# Patient Record
Sex: Female | Born: 1970
Health system: Southern US, Community
[De-identification: ages and names within clinical notes are randomized; demographics above are authoritative.]

## PROBLEM LIST (undated history)

## (undated) DIAGNOSIS — Z808 Family history of malignant neoplasm of other organs or systems: Secondary | ICD-10-CM

## (undated) DIAGNOSIS — R112 Nausea with vomiting, unspecified: Secondary | ICD-10-CM

## (undated) DIAGNOSIS — Z803 Family history of malignant neoplasm of breast: Secondary | ICD-10-CM

## (undated) DIAGNOSIS — Z9889 Other specified postprocedural states: Secondary | ICD-10-CM

## (undated) DIAGNOSIS — K589 Irritable bowel syndrome without diarrhea: Secondary | ICD-10-CM

## (undated) DIAGNOSIS — R921 Mammographic calcification found on diagnostic imaging of breast: Secondary | ICD-10-CM

## (undated) DIAGNOSIS — Z8049 Family history of malignant neoplasm of other genital organs: Secondary | ICD-10-CM

## (undated) DIAGNOSIS — Z923 Personal history of irradiation: Secondary | ICD-10-CM

## (undated) DIAGNOSIS — Z800 Family history of malignant neoplasm of digestive organs: Secondary | ICD-10-CM

## (undated) DIAGNOSIS — C50919 Malignant neoplasm of unspecified site of unspecified female breast: Secondary | ICD-10-CM

## (undated) DIAGNOSIS — C50211 Malignant neoplasm of upper-inner quadrant of right female breast: Secondary | ICD-10-CM

## (undated) DIAGNOSIS — N6009 Solitary cyst of unspecified breast: Principal | ICD-10-CM

## (undated) HISTORY — DX: Family history of malignant neoplasm of other genital organs: Z80.49

## (undated) HISTORY — DX: Family history of malignant neoplasm of digestive organs: Z80.0

## (undated) HISTORY — DX: Malignant neoplasm of upper-inner quadrant of right female breast: C50.211

## (undated) HISTORY — DX: Malignant neoplasm of unspecified site of unspecified female breast: C50.919

## (undated) HISTORY — DX: Solitary cyst of unspecified breast: N60.09

## (undated) HISTORY — DX: Family history of malignant neoplasm of breast: Z80.3

## (undated) HISTORY — DX: Family history of malignant neoplasm of other organs or systems: Z80.8

## (undated) HISTORY — DX: Irritable bowel syndrome, unspecified: K58.9

---

## 1991-10-10 HISTORY — PX: BREAST BIOPSY: SHX20

## 1997-08-27 ENCOUNTER — Other Ambulatory Visit: Admission: RE | Admit: 1997-08-27 | Discharge: 1997-08-27 | Payer: Self-pay | Admitting: Obstetrics and Gynecology

## 1998-09-06 ENCOUNTER — Other Ambulatory Visit: Admission: RE | Admit: 1998-09-06 | Discharge: 1998-09-06 | Payer: Self-pay | Admitting: Obstetrics and Gynecology

## 1999-08-18 ENCOUNTER — Other Ambulatory Visit: Admission: RE | Admit: 1999-08-18 | Discharge: 1999-08-18 | Payer: Self-pay | Admitting: Obstetrics and Gynecology

## 2000-01-09 ENCOUNTER — Inpatient Hospital Stay (HOSPITAL_COMMUNITY): Admission: AD | Admit: 2000-01-09 | Discharge: 2000-01-09 | Payer: Self-pay | Admitting: Obstetrics and Gynecology

## 2000-03-08 ENCOUNTER — Inpatient Hospital Stay (HOSPITAL_COMMUNITY): Admission: AD | Admit: 2000-03-08 | Discharge: 2000-03-08 | Payer: Self-pay | Admitting: Obstetrics and Gynecology

## 2000-03-30 ENCOUNTER — Inpatient Hospital Stay (HOSPITAL_COMMUNITY): Admission: AD | Admit: 2000-03-30 | Discharge: 2000-04-02 | Payer: Self-pay | Admitting: Obstetrics and Gynecology

## 2000-10-16 ENCOUNTER — Other Ambulatory Visit: Admission: RE | Admit: 2000-10-16 | Discharge: 2000-10-16 | Payer: Self-pay | Admitting: Obstetrics and Gynecology

## 2001-10-22 ENCOUNTER — Other Ambulatory Visit: Admission: RE | Admit: 2001-10-22 | Discharge: 2001-10-22 | Payer: Self-pay | Admitting: Obstetrics and Gynecology

## 2002-10-27 ENCOUNTER — Other Ambulatory Visit: Admission: RE | Admit: 2002-10-27 | Discharge: 2002-10-27 | Payer: Self-pay | Admitting: Obstetrics and Gynecology

## 2003-11-03 ENCOUNTER — Other Ambulatory Visit: Admission: RE | Admit: 2003-11-03 | Discharge: 2003-11-03 | Payer: Self-pay | Admitting: Obstetrics and Gynecology

## 2004-11-03 ENCOUNTER — Other Ambulatory Visit: Admission: RE | Admit: 2004-11-03 | Discharge: 2004-11-03 | Payer: Self-pay | Admitting: Obstetrics and Gynecology

## 2008-03-05 ENCOUNTER — Encounter: Admission: RE | Admit: 2008-03-05 | Discharge: 2008-03-05 | Payer: Self-pay | Admitting: Neurology

## 2009-11-11 ENCOUNTER — Encounter: Admission: RE | Admit: 2009-11-11 | Discharge: 2009-11-11 | Payer: Self-pay | Admitting: Obstetrics and Gynecology

## 2010-05-27 NOTE — Op Note (Signed)
[facility]  Patient:    Renee Wright, Renee Wright                       MRN: [id number] Proc. Date: 03/30/00 Attending:  Janeece Riggers. Dareen Piano, M.D.                           Operative Report  PREOPERATIVE DIAGNOSES:       1. Intrauterine pregnancy at 69 weeks estimated                                  gestational age.                               2. History of prior cesarean section.                               3. The patient refuses attempt at vaginal birth                                  after cesarean section.  POSTOPERATIVE DIAGNOSES:      1. Intrauterine pregnancy at 56 weeks estimated                                  gestational age.                               2. History of prior cesarean section.                               3. The patient refuses attempt at vaginal birth                                  after cesarean section.  PROCEDURE:                    Repeat low transverse cesarean section.  SURGEON:                      Mark E. Dareen Piano, M.D.  ASSISTANT:                    Miguel Aschoff, M.D.  ANESTHESIA:                   Spinal.  ANTIBIOTICS:                  Ancef 1 g.  ESTIMATED BLOOD LOSS:         900 CC.  SPECIMENS:                    None.  COMPLICATIONS:                None.  FINDINGS:                     The patient had normal-appearing fallopian tubes and ovaries.  The placenta  appeared to be normal.  The patient delivered one live viable white female infant weighing 7 lb 15 oz.  DESCRIPTION OF PROCEDURE:     The patient was taken to the operating room, where a spinal anesthetic was administered without complication.  She was then placed in the dorsal supine position with a left lateral tilt.  She was prepped with Hibiclens and a Foley catheter placed.  She was then draped in the usual fashion for this procedure.  A Pfannenstiel incision was made through the previous scar.  This was carried down to the fascia.  The  fascia was nicked in the midline and extended laterally.  The rectus muscles were then separated from the fascia with a Bovie.  The rectus muscles were divided in the midline and taken superiorly and inferiorly.  The parietal peritoneum was entered sharply.  A bladder flap was taken down sharply.  A low transverse uterine incision was made in the midline and extended laterally with blunt dissection.  The amniotic sac was then entered with a hemostat.  Amniotic fluid was noted to be clear.  The infant was delivered in the vertex presentation.  On delivery of the head, the oropharynx and nostrils were bulb suctioned.  The remaining infant was then delivered.  The cord was doubly clamped and cut and the infant handed to the waiting NICU team.  Cord blood was then obtained.  The placenta was then manually removed.  The uterus was exteriorized.  The uterine cavity was wiped with a wet lap.  The uterine incision was closed in single layer of 0 chromic in a running locking fashion. The bladder flap was closed using 3-0 chromic in a running fashion.  The uterus was then placed back in the abdominal cavity.  Hemostasis was checked and found to be adequate.  The abdominal gutters were wiped with a wet lap. The parietal peritoneum was closed using 3-0 chromic in a running fashion. The fascia was closed using 0 Monocryl in a running fashion.  The subcuticular tissue was then made hemostatic with a Bovie.  The previous scar was excised. The subcuticular tissue was closes using interrupted 2-0 cut suture. Stainless steel clips were used to close skin.  The patient tolerated the procedure well.  She was taken to the recovery room in stable condition. Instrument and lap counts were correct x 2. DD:  03/30/00 TD:  04/01/00 Job: [id number]

## 2010-05-27 NOTE — Discharge Summary (Signed)
[facility]  Patient:    Renee Wright, Renee Wright                     MRN: [id number] Adm. Date:  [id number] Disc. Date: [id number] Attending:  Osborn Coho                           Discharge Summary  ADMISSION DIAGNOSES:          1. Intrauterine pregnancy at 39 weeks.                               2. Previous cesarean section, desires repeat                                  cesarean section.  DISCHARGE DIAGNOSES:          1. Intrauterine pregnancy at 39 weeks.                               2. Previous cesarean section, desires repeat                                  cesarean section.  HOSPITAL COURSE:              The patient is a 40 year old gravida 2, para 1, with previous cesarean section, who presents for repeat C-section.  She underwent a repeat C-section without incident.  On postoperative day #1 was doing very well.  Her postoperative hematocrit was 31.2, and postoperative white blood cell count on postoperative day #1 was 13.8.  By postoperative day #3 she was ambulating, tolerating regular food, was afebrile, with stable vital signs.  She was discharged home on postoperative day #3.  She is discharged home with a prescription for Tylox and Motrin to take as needed for pain.  She will follow up in the office in four weeks.  She was advised to call if she has any nausea, any vomiting, any temperature greater than 100.5, or any redness or drainage from the incision site. DD:  04/18/00 TD:  04/18/00 Job: [id number]

## 2010-12-19 ENCOUNTER — Other Ambulatory Visit: Payer: Self-pay | Admitting: Obstetrics and Gynecology

## 2010-12-19 DIAGNOSIS — R928 Other abnormal and inconclusive findings on diagnostic imaging of breast: Secondary | ICD-10-CM

## 2010-12-20 ENCOUNTER — Ambulatory Visit
Admission: RE | Admit: 2010-12-20 | Discharge: 2010-12-20 | Disposition: A | Discharge status: Home / Self Care | Payer: BC Managed Care – PPO | Source: Ambulatory Visit | Attending: Obstetrics and Gynecology | Admitting: Obstetrics and Gynecology

## 2010-12-20 ENCOUNTER — Other Ambulatory Visit: Payer: Self-pay | Admitting: Obstetrics and Gynecology

## 2010-12-20 DIAGNOSIS — R921 Mammographic calcification found on diagnostic imaging of breast: Secondary | ICD-10-CM

## 2010-12-20 DIAGNOSIS — R928 Other abnormal and inconclusive findings on diagnostic imaging of breast: Secondary | ICD-10-CM

## 2010-12-23 ENCOUNTER — Ambulatory Visit
Admission: RE | Admit: 2010-12-23 | Discharge: 2010-12-23 | Disposition: A | Discharge status: Home / Self Care | Payer: BC Managed Care – PPO | Source: Ambulatory Visit | Attending: Obstetrics and Gynecology | Admitting: Obstetrics and Gynecology

## 2010-12-23 DIAGNOSIS — R921 Mammographic calcification found on diagnostic imaging of breast: Secondary | ICD-10-CM

## 2010-12-30 ENCOUNTER — Other Ambulatory Visit: Payer: Self-pay

## 2011-01-10 DIAGNOSIS — R921 Mammographic calcification found on diagnostic imaging of breast: Secondary | ICD-10-CM

## 2011-01-10 HISTORY — DX: Mammographic calcification found on diagnostic imaging of breast: R92.1

## 2011-01-13 DIAGNOSIS — N6009 Solitary cyst of unspecified breast: Secondary | ICD-10-CM

## 2011-01-13 HISTORY — DX: Solitary cyst of unspecified breast: N60.09

## 2011-01-17 ENCOUNTER — Other Ambulatory Visit (INDEPENDENT_AMBULATORY_CARE_PROVIDER_SITE_OTHER): Payer: Self-pay | Admitting: Surgery

## 2011-01-17 ENCOUNTER — Ambulatory Visit (INDEPENDENT_AMBULATORY_CARE_PROVIDER_SITE_OTHER): Payer: BC Managed Care – PPO | Admitting: Surgery

## 2011-01-17 ENCOUNTER — Encounter (INDEPENDENT_AMBULATORY_CARE_PROVIDER_SITE_OTHER): Payer: Self-pay | Admitting: Surgery

## 2011-01-17 VITALS — BP 100/64 | HR 68 | Resp 12 | Ht 65.0 in | Wt 129.0 lb

## 2011-01-17 DIAGNOSIS — R921 Mammographic calcification found on diagnostic imaging of breast: Secondary | ICD-10-CM

## 2011-01-17 DIAGNOSIS — R928 Other abnormal and inconclusive findings on diagnostic imaging of breast: Secondary | ICD-10-CM

## 2011-01-17 NOTE — Progress Notes (Signed)
NAME: Renee Wright                                                                                      DOB: 01/03/1971 DATE: 01/17/2011               MRN: [id number]   CC:  Chief Complaint  Patient presents with  . Breast Problem    calcifications right    HPI:  Renee Wright is a 40 y.o.  female who was referred  by Dr. Brozzettifor evaluation of right breast calcifications. These were incidentally noted and not amenable to a NCB  PMH:  has no past medical history on file.  PSH:   has past surgical history that includes Cesarean section (05/29/96, 03/30/00) and Breast biopsy (10/1991).  ALLERGIES:  No Known Allergies  MEDICATIONS: Current outpatient prescriptions:[name] 3-0.02 MG tablet, , Disp: , Rfl:   ROS: She has filled out our 12 point review of systems and it is negative.   EXAM:   GENERAL:  The patient is alert, oriented, and generally healthy-appearing, NAD. Mood and affect are normal.  HEENT:  The head is normocephalic, the eyes nonicteric, the pupils were round regular and equal. EOMs are normal. Pharynx normal. Dentition good.  NECK:  The neck is supple and there are no masses or thyromegaly.  LUNGS: Normal respirations and clear to auscultation.  HEART: Regular rhythm, with no murmurs rubs or gallops. Pulses are intact carotid dorsalis pedis and posterior tibial. No significant varicosities are noted.  BREASTS: Small, dense, symmetric, no mass, tenderness, skin changes or discharge  ABDOMEN: Soft, flat, and nontender. No masses or organomegaly is noted. No hernias are noted. Bowel sounds are normal.  EXTREMITIES:  Good range of motion, no edema.   DATA REVIEWED:  Mammogram noted  IMPRESSION:  Breast calcifications, right, not amenable to stereo biopsy  PLAN:   NL Excision. Discussed alternatives including 6 month f/u and MRI She would like to schedule surgery. I have discussed the indications for the lumpectomy and described the procedure. She  understand that the chance of removal of the abnormal area is very good, but that occasionally we are unable to locate it and may have to do a second procedure. We also discussed the possibility of a second procedure to get additional tissue. Risks of surgery such as bleeding and infection have also been explained, as well as the implications of not doing the surgery. She understands and wishes to proceed.   Jolyn Deshmukh J 01/17/2011  CC: Brozzetti, Kelly Ann, MD, ANDERSON,MARK E, MD        

## 2011-01-17 NOTE — Patient Instructions (Signed)
We will arrange surgery to remove the small area of calcium deposits in your right breast

## 2011-01-30 ENCOUNTER — Encounter (HOSPITAL_BASED_OUTPATIENT_CLINIC_OR_DEPARTMENT_OTHER): Payer: Self-pay | Admitting: *Deleted

## 2011-02-02 ENCOUNTER — Encounter (HOSPITAL_BASED_OUTPATIENT_CLINIC_OR_DEPARTMENT_OTHER): Payer: Self-pay | Admitting: Certified Registered Nurse Anesthetist

## 2011-02-02 ENCOUNTER — Encounter (HOSPITAL_BASED_OUTPATIENT_CLINIC_OR_DEPARTMENT_OTHER): Payer: Self-pay | Admitting: *Deleted

## 2011-02-02 ENCOUNTER — Encounter (HOSPITAL_BASED_OUTPATIENT_CLINIC_OR_DEPARTMENT_OTHER): Payer: Self-pay | Admitting: Anesthesiology

## 2011-02-02 ENCOUNTER — Other Ambulatory Visit (INDEPENDENT_AMBULATORY_CARE_PROVIDER_SITE_OTHER): Payer: Self-pay | Admitting: Surgery

## 2011-02-02 ENCOUNTER — Ambulatory Visit (HOSPITAL_BASED_OUTPATIENT_CLINIC_OR_DEPARTMENT_OTHER)
Admission: RE | Admit: 2011-02-02 | Discharge: 2011-02-02 | Disposition: A | Discharge status: Home / Self Care | Payer: BC Managed Care – PPO | Source: Ambulatory Visit | Attending: Surgery | Admitting: Surgery

## 2011-02-02 ENCOUNTER — Ambulatory Visit (HOSPITAL_BASED_OUTPATIENT_CLINIC_OR_DEPARTMENT_OTHER): Payer: BC Managed Care – PPO | Admitting: Anesthesiology

## 2011-02-02 ENCOUNTER — Ambulatory Visit
Admission: RE | Admit: 2011-02-02 | Discharge: 2011-02-02 | Disposition: A | Discharge status: Home / Self Care | Payer: BC Managed Care – PPO | Source: Ambulatory Visit | Attending: Surgery | Admitting: Surgery

## 2011-02-02 ENCOUNTER — Encounter (HOSPITAL_BASED_OUTPATIENT_CLINIC_OR_DEPARTMENT_OTHER)
Admission: RE | Disposition: A | Discharge status: Home / Self Care | Payer: Self-pay | Source: Ambulatory Visit | Attending: Surgery

## 2011-02-02 DIAGNOSIS — R921 Mammographic calcification found on diagnostic imaging of breast: Secondary | ICD-10-CM

## 2011-02-02 DIAGNOSIS — N6029 Fibroadenosis of unspecified breast: Secondary | ICD-10-CM | POA: Insufficient documentation

## 2011-02-02 DIAGNOSIS — D249 Benign neoplasm of unspecified breast: Secondary | ICD-10-CM

## 2011-02-02 DIAGNOSIS — N6019 Diffuse cystic mastopathy of unspecified breast: Secondary | ICD-10-CM

## 2011-02-02 DIAGNOSIS — R928 Other abnormal and inconclusive findings on diagnostic imaging of breast: Secondary | ICD-10-CM | POA: Insufficient documentation

## 2011-02-02 DIAGNOSIS — N6089 Other benign mammary dysplasias of unspecified breast: Secondary | ICD-10-CM | POA: Insufficient documentation

## 2011-02-02 HISTORY — DX: Mammographic calcification found on diagnostic imaging of breast: R92.1

## 2011-02-02 HISTORY — PX: BREAST BIOPSY: SHX20

## 2011-02-02 HISTORY — PX: BREAST EXCISIONAL BIOPSY: SUR124

## 2011-02-02 LAB — POCT HEMOGLOBIN-HEMACUE: Hemoglobin: 14.2 g/dL (ref 12.0–15.0)

## 2011-02-02 SURGERY — BREAST BIOPSY WITH NEEDLE LOCALIZATION
Anesthesia: General | Site: Breast | Laterality: Right | Wound class: Clean

## 2011-02-02 MED ORDER — METOCLOPRAMIDE HCL 5 MG/ML IJ SOLN: 10.0000 mg | Freq: Once | INTRAMUSCULAR | Status: DC | PRN

## 2011-02-02 MED ORDER — METOCLOPRAMIDE HCL 5 MG/ML IJ SOLN
INTRAMUSCULAR | Status: DC | PRN
  Administered 2011-02-02: 10 mg via INTRAVENOUS

## 2011-02-02 MED ORDER — CHLORHEXIDINE GLUCONATE 4 % EX LIQD: 1.0000 "application " | Freq: Once | CUTANEOUS | Status: DC

## 2011-02-02 MED ORDER — OXYCODONE HCL 5 MG PO TABS: 5.0000 mg | ORAL_TABLET | ORAL | Status: AC | PRN

## 2011-02-02 MED ORDER — FENTANYL CITRATE 0.05 MG/ML IJ SOLN: 25.0000 ug | INTRAMUSCULAR | Status: DC | PRN

## 2011-02-02 MED ORDER — MIDAZOLAM HCL 5 MG/5ML IJ SOLN
INTRAMUSCULAR | Status: DC | PRN
  Administered 2011-02-02: 2 mg via INTRAVENOUS

## 2011-02-02 MED ORDER — FENTANYL CITRATE 0.05 MG/ML IJ SOLN
INTRAMUSCULAR | Status: DC | PRN
  Administered 2011-02-02: 50 ug via INTRAVENOUS
  Administered 2011-02-02: 100 ug via INTRAVENOUS

## 2011-02-02 MED ORDER — DEXAMETHASONE SODIUM PHOSPHATE 4 MG/ML IJ SOLN
INTRAMUSCULAR | Status: DC | PRN
  Administered 2011-02-02: 10 mg via INTRAVENOUS

## 2011-02-02 MED ORDER — CEFAZOLIN SODIUM 1-5 GM-% IV SOLN
1.0000 g | INTRAVENOUS | Status: AC
  Administered 2011-02-02: 1 g via INTRAVENOUS

## 2011-02-02 MED ORDER — LIDOCAINE HCL (CARDIAC) 20 MG/ML IV SOLN
INTRAVENOUS | Status: DC | PRN
  Administered 2011-02-02: 50 mg via INTRAVENOUS

## 2011-02-02 MED ORDER — MIDAZOLAM HCL 2 MG/2ML IJ SOLN: 0.5000 mg | INTRAMUSCULAR | Status: DC | PRN

## 2011-02-02 MED ORDER — BUPIVACAINE HCL (PF) 0.25 % IJ SOLN
INTRAMUSCULAR | Status: DC | PRN
  Administered 2011-02-02: 20 mL

## 2011-02-02 MED ORDER — PROPOFOL 10 MG/ML IV EMUL
INTRAVENOUS | Status: DC | PRN
  Administered 2011-02-02: 150 mg via INTRAVENOUS

## 2011-02-02 MED ORDER — MORPHINE SULFATE 4 MG/ML IJ SOLN: 0.0500 mg/kg | INTRAMUSCULAR | Status: DC | PRN

## 2011-02-02 MED ORDER — ACETAMINOPHEN 10 MG/ML IV SOLN
1000.0000 mg | Freq: Once | INTRAVENOUS | Status: AC
  Administered 2011-02-02: 1000 mg via INTRAVENOUS

## 2011-02-02 MED ORDER — LACTATED RINGERS IV SOLN
INTRAVENOUS | Status: DC
  Administered 2011-02-02: 13:00:00 via INTRAVENOUS

## 2011-02-02 MED ORDER — ONDANSETRON HCL 4 MG/2ML IJ SOLN
INTRAMUSCULAR | Status: DC | PRN
  Administered 2011-02-02: 4 mg via INTRAVENOUS

## 2011-02-02 SURGICAL SUPPLY — 43 items
APPLICATOR COTTON TIP 6IN STRL (MISCELLANEOUS) IMPLANT
BINDER BREAST LRG (GAUZE/BANDAGES/DRESSINGS) IMPLANT
BINDER BREAST MEDIUM (GAUZE/BANDAGES/DRESSINGS) ×2 IMPLANT
BINDER BREAST XLRG (GAUZE/BANDAGES/DRESSINGS) IMPLANT
BINDER BREAST XXLRG (GAUZE/BANDAGES/DRESSINGS) IMPLANT
BLADE HEX COATED 2.75 (ELECTRODE) ×2 IMPLANT
BLADE SURG 15 STRL LF DISP TIS (BLADE) ×1 IMPLANT
BLADE SURG 15 STRL SS (BLADE) ×1
CANISTER SUCTION 1200CC (MISCELLANEOUS) ×2 IMPLANT
CHLORAPREP W/TINT 26ML (MISCELLANEOUS) ×2 IMPLANT
CLIP TI MEDIUM 6 (CLIP) IMPLANT
CLIP TI WIDE RED SMALL 6 (CLIP) IMPLANT
CLOTH BEACON ORANGE TIMEOUT ST (SAFETY) ×2 IMPLANT
COVER MAYO STAND STRL (DRAPES) ×2 IMPLANT
COVER TABLE BACK 60X90 (DRAPES) ×2 IMPLANT
DECANTER SPIKE VIAL GLASS SM (MISCELLANEOUS) IMPLANT
DERMABOND ADVANCED (GAUZE/BANDAGES/DRESSINGS) ×1
DERMABOND ADVANCED .7 DNX12 (GAUZE/BANDAGES/DRESSINGS) ×1 IMPLANT
DEVICE DUBIN W/COMP PLATE 8390 (MISCELLANEOUS) ×2 IMPLANT
DRAPE LAPAROTOMY TRNSV 102X78 (DRAPE) ×2 IMPLANT
DRAPE UTILITY XL STRL (DRAPES) ×2 IMPLANT
ELECT REM PT RETURN 9FT ADLT (ELECTROSURGICAL) ×2
ELECTRODE REM PT RTRN 9FT ADLT (ELECTROSURGICAL) ×1 IMPLANT
GLOVE EUDERMIC 7 POWDERFREE (GLOVE) ×2 IMPLANT
GOWN PREVENTION PLUS XLARGE (GOWN DISPOSABLE) ×4 IMPLANT
KIT MARKER MARGIN INK (KITS) IMPLANT
NEEDLE HYPO 25X1 1.5 SAFETY (NEEDLE) ×2 IMPLANT
NS IRRIG 1000ML POUR BTL (IV SOLUTION) ×2 IMPLANT
PACK BASIN DAY SURGERY FS (CUSTOM PROCEDURE TRAY) ×2 IMPLANT
PENCIL BUTTON HOLSTER BLD 10FT (ELECTRODE) ×2 IMPLANT
SLEEVE SCD COMPRESS KNEE MED (MISCELLANEOUS) ×2 IMPLANT
SPONGE GAUZE 4X4 12PLY (GAUZE/BANDAGES/DRESSINGS) ×2 IMPLANT
SPONGE INTESTINAL PEANUT (DISPOSABLE) IMPLANT
SPONGE LAP 4X18 X RAY DECT (DISPOSABLE) ×2 IMPLANT
STAPLER VISISTAT 35W (STAPLE) IMPLANT
SUT MNCRL AB 4-0 PS2 18 (SUTURE) ×2 IMPLANT
SUT SILK 0 TIES 10X30 (SUTURE) IMPLANT
SUT VICRYL 3-0 CR8 SH (SUTURE) ×2 IMPLANT
SYR CONTROL 10ML LL (SYRINGE) ×2 IMPLANT
TOWEL OR NON WOVEN STRL DISP B (DISPOSABLE) ×2 IMPLANT
TUBE CONNECTING 20X1/4 (TUBING) ×2 IMPLANT
WATER STERILE IRR 1000ML POUR (IV SOLUTION) ×2 IMPLANT
YANKAUER SUCT BULB TIP NO VENT (SUCTIONS) ×2 IMPLANT

## 2011-02-02 NOTE — Anesthesia Preprocedure Evaluation (Signed)
Anesthesia Evaluation  Patient identified by MRN, date of birth, ID band Patient awake    Reviewed: Allergy & Precautions, H&P , NPO status , Patient's Chart, lab work & pertinent test results, reviewed documented beta blocker date and time   Airway Mallampati: II TM Distance: >3 FB Neck ROM: full    Dental   Pulmonary neg pulmonary ROS,          Cardiovascular neg cardio ROS     Neuro/Psych Negative Neurological ROS  Negative Psych ROS   GI/Hepatic negative GI ROS, Neg liver ROS,   Endo/Other  Negative Endocrine ROS  Renal/GU negative Renal ROS  Genitourinary negative   Musculoskeletal   Abdominal   Peds  Hematology negative hematology ROS (+)   Anesthesia Other Findings See surgeon's H&P   Reproductive/Obstetrics negative OB ROS                           Anesthesia Physical Anesthesia Plan  ASA: I  Anesthesia Plan: General   Post-op Pain Management:    Induction: Intravenous  Airway Management Planned: LMA  Additional Equipment:   Intra-op Plan:   Post-operative Plan: Extubation in OR  Informed Consent: I have reviewed the patients History and Physical, chart, labs and discussed the procedure including the risks, benefits and alternatives for the proposed anesthesia with the patient or authorized representative who has indicated his/her understanding and acceptance.     Plan Discussed with: CRNA and Surgeon  Anesthesia Plan Comments:         Anesthesia Quick Evaluation  

## 2011-02-02 NOTE — Anesthesia Procedure Notes (Signed)
Procedure Name: LMA Insertion Performed by: Aveline Daus Pre-anesthesia Checklist: Patient identified, Timeout performed, Emergency Drugs available, Suction available and Patient being monitored Patient Re-evaluated:Patient Re-evaluated prior to inductionOxygen Delivery Method: Circle System Utilized Preoxygenation: Pre-oxygenation with 100% oxygen Intubation Type: IV induction Ventilation: Mask ventilation without difficulty LMA: LMA inserted LMA Size: 4.0 Number of attempts: 1 Placement Confirmation: breath sounds checked- equal and bilateral and positive ETCO2 Tube secured with: Tape Dental Injury: Teeth and Oropharynx as per pre-operative assessment      

## 2011-02-02 NOTE — Interval H&P Note (Signed)
History and Physical Interval Note:  02/02/2011 1:29 PM  Renee Wright  has presented today for surgery, with the diagnosis of Right breast calcifications  The various methods of treatment have been discussed with the patient and family. After consideration of risks, benefits and other options for treatment, the patient has consented to  Procedure(s): BREAST BIOPSY WITH NEEDLE LOCALIZATION as a surgical intervention .  The patients' history has been reviewed, patient examined, no change in status, stable for surgery.  I have reviewed the patients' chart and labs.  Questions were answered to the patient's satisfaction.  I initialed the right breast as the operative side   Lyndsy Gilberto J

## 2011-02-02 NOTE — Anesthesia Postprocedure Evaluation (Signed)
Anesthesia Post Note  Patient: Renee Wright  Procedure(s) Performed:  BREAST BIOPSY WITH NEEDLE LOCALIZATION - removal of right breast calcifications  Anesthesia type: General  Patient location: PACU  Post pain: Pain level controlled  Post assessment: Patient's Cardiovascular Status Stable  Last Vitals:  Filed Vitals:   02/02/11 1500  BP: 103/63  Pulse: 87  Temp:   Resp: 16    Post vital signs: Reviewed and stable  Level of consciousness: alert  Complications: No apparent anesthesia complications

## 2011-02-02 NOTE — Transfer of Care (Signed)
Immediate Anesthesia Transfer of Care Note  Patient: Renee Wright  Procedure(s) Performed:  BREAST BIOPSY WITH NEEDLE LOCALIZATION - removal of right breast calcifications  Patient Location: PACU  Anesthesia Type: General  Level of Consciousness: sedated  Airway & Oxygen Therapy: Patient Spontanous Breathing and Patient connected to face mask oxygen  Post-op Assessment: Report given to PACU RN and Post -op Vital signs reviewed and stable  Post vital signs: Reviewed and stable  Complications: No apparent anesthesia complications

## 2011-02-02 NOTE — Op Note (Signed)
Renee Wright  06/03/1970  [id number]  02/02/2011   Preoperative diagnosis: Abnormal calcifications right breast upper outer quadrant  Postoperative diagnosis: Same  Procedure: Needle guided excision breast calcifications right upper-outer quadrant  Surgeon: Currie Paris, MD, FACS  Anesthesia: General  Clinical History and Indications: This patient was found to have some calcifications in the upper-outer quadrant. These were indeterminate by mammogram and not amenable to needle core biopsy. Excisional biopsy was recommended.  Description of Procedure:I saw the patient preoperative area and from the plans as noted above. I marked the right breast as the operative side. I reviewed the mammogram films and spoke with Dr. RE Rowe Pavy the location. The guidewire entered medially and one laterally. The calcifications appear to be just in the upper-outer quadrant.  Patient taken to the operative room after satisfaction she'll anesthesia the breast was prepped and draped in a time out was done. The calcifications appear to be 3.5 cm from the skin entry site of the guidewire. I marked that area and made a curvilinear incision centered on the mark. I elevated the thin skin flaps going medially so I could manipulate the guidewire into the wound. It is fairly superficial in its tract.I then took tissue around the guidewire. The patient's breasts are fairly thin and I was down on the deep margin to the pre-muscular fatty plane. The specimen mammogram was reviewed with Dr. Azucena Kuba andthe calcifications appear to be in the specimen.  I put 20 cc of 0.25% plain Marcaine and to help with postop pain relief. I elevated the breast tissue off of the pectoralis and closed the deep layers with 3-0 Vicryl. The more superficial breast tissue was closed with 3-0 Vicryl and then the skin with 4-0 Monocryl subcuticular plus Dermabond. The patient tolerated the procedure well. There were no operative complications.  All counts were correct.   EBL: Minimal  Currie Paris, MD, FACS 02/02/2011 2:33 PM

## 2011-02-02 NOTE — H&P (View-Only) (Signed)
NAME: Renee Wright                                                                                      DOB: 07/16/1970 DATE: 01/17/2011               MRN: [id number]   CC:  Chief Complaint  Patient presents with  . Breast Problem    calcifications right    HPI:  Renee Wright is a 41 y.o.  female who was referred  by Dr. Luciano Cutter evaluation of right breast calcifications. These were incidentally noted and not amenable to a NCB  PMH:  has no past medical history on file.  PSH:   has past surgical history that includes Cesarean section (05/29/96, 03/30/00) and Breast biopsy (10/1991).  ALLERGIES:  No Known Allergies  MEDICATIONS: Current outpatient prescriptions:[name] 3-0.02 MG tablet, , Disp: , Rfl:   ROS: She has filled out our 12 point review of systems and it is negative.   EXAM:   GENERAL:  The patient is alert, oriented, and generally healthy-appearing, NAD. Mood and affect are normal.  HEENT:  The head is normocephalic, the eyes nonicteric, the pupils were round regular and equal. EOMs are normal. Pharynx normal. Dentition good.  NECK:  The neck is supple and there are no masses or thyromegaly.  LUNGS: Normal respirations and clear to auscultation.  HEART: Regular rhythm, with no murmurs rubs or gallops. Pulses are intact carotid dorsalis pedis and posterior tibial. No significant varicosities are noted.  BREASTS: Small, dense, symmetric, no mass, tenderness, skin changes or discharge  ABDOMEN: Soft, flat, and nontender. No masses or organomegaly is noted. No hernias are noted. Bowel sounds are normal.  EXTREMITIES:  Good range of motion, no edema.   DATA REVIEWED:  Mammogram noted  IMPRESSION:  Breast calcifications, right, not amenable to stereo biopsy  PLAN:   NL Excision. Discussed alternatives including 6 month f/u and MRI She would like to schedule surgery. I have discussed the indications for the lumpectomy and described the procedure. She  understand that the chance of removal of the abnormal area is very good, but that occasionally we are unable to locate it and may have to do a second procedure. We also discussed the possibility of a second procedure to get additional tissue. Risks of surgery such as bleeding and infection have also been explained, as well as the implications of not doing the surgery. She understands and wishes to proceed.   Renee Wright 01/17/2011  CC: Henrene Pastor, MD, Levi Aland, MD

## 2011-02-03 ENCOUNTER — Encounter: Payer: Self-pay | Admitting: *Deleted

## 2011-02-06 ENCOUNTER — Telehealth (INDEPENDENT_AMBULATORY_CARE_PROVIDER_SITE_OTHER): Payer: Self-pay | Admitting: General Surgery

## 2011-02-06 NOTE — Telephone Encounter (Signed)
Patient made aware of path results. Will follow up at appt and call with any questions prior.  

## 2011-02-06 NOTE — Telephone Encounter (Signed)
Message copied by Liliana Cline on Mon Feb 06, 2011  4:22 PM ------      Message from: Currie Paris      Created: Mon Feb 06, 2011 10:56 AM       Tell her path is benign and as expected

## 2011-02-15 ENCOUNTER — Ambulatory Visit (INDEPENDENT_AMBULATORY_CARE_PROVIDER_SITE_OTHER): Payer: BC Managed Care – PPO | Admitting: Surgery

## 2011-02-15 ENCOUNTER — Encounter (INDEPENDENT_AMBULATORY_CARE_PROVIDER_SITE_OTHER): Payer: Self-pay | Admitting: Surgery

## 2011-02-15 VITALS — BP 106/68 | HR 56 | Temp 98.0°F | Resp 12 | Ht 65.0 in | Wt 126.6 lb

## 2011-02-15 DIAGNOSIS — R928 Other abnormal and inconclusive findings on diagnostic imaging of breast: Secondary | ICD-10-CM

## 2011-02-15 DIAGNOSIS — R921 Mammographic calcification found on diagnostic imaging of breast: Secondary | ICD-10-CM

## 2011-02-15 DIAGNOSIS — Z09 Encounter for follow-up examination after completed treatment for conditions other than malignant neoplasm: Secondary | ICD-10-CM

## 2011-02-15 NOTE — Patient Instructions (Signed)
We will see you again on an as needed basis. Please call the office at [phone] if you have any questions or concerns. Thank you for allowing us to take care of you.  

## 2011-02-15 NOTE — Progress Notes (Signed)
NAME: Renee Wright                                            DOB: [birth year] DATE: 02/15/2011                                                  MRN: [id number]  CC: Post op   HPI: This patient comes in for post op follow-up.Sheunderwent wire localized right breast biopsy for calcifications on 01/17/2011. She feels that she is doing well.  PE: General: The patient appears to be healthy, NAD Incision healing nicely, no infection  DATA REVIEWED: Path noted - benign   IMPRESSION: The patient is doing well S/P NL excisional biopsy.    PLAN: RTC PRN Gave patient a copy of path and discussed it with her

## 2011-10-13 ENCOUNTER — Ambulatory Visit (INDEPENDENT_AMBULATORY_CARE_PROVIDER_SITE_OTHER): Payer: BC Managed Care – PPO | Admitting: Surgery

## 2011-10-13 ENCOUNTER — Encounter (INDEPENDENT_AMBULATORY_CARE_PROVIDER_SITE_OTHER): Payer: Self-pay | Admitting: Surgery

## 2011-10-13 VITALS — BP 118/68 | HR 74 | Temp 98.0°F | Resp 18 | Ht 65.0 in | Wt 127.0 lb

## 2011-10-13 DIAGNOSIS — N6009 Solitary cyst of unspecified breast: Secondary | ICD-10-CM

## 2011-10-13 NOTE — Progress Notes (Signed)
Renee Wright DOB: 02-13-1970 MRN: [id number]                                                                                      DATE: 10/13/2011  PCP: Levi Aland, MD Referring Provider: Levi Aland, MD  IMPRESSION:  Breast cystr  PLAN:   I discussed the alternatives of aspiration versus followup. This was present on a prior ultrasound 9 months ago and basically has perhaps an enlarged little bit but continues to be benign appearing. She would like to follow up rather than aspiration at this time                 CC:  Chief Complaint  Patient presents with  . Breast Mass    Right    HPI:  Renee Wright is a 41 y.o.  female who presents for evaluation of a newly found right breast mass. She noted it while bathing. It is not painful. We did a NL excision of calcs in the upper oouter quadrant of the right breast but this is more in the 7:30 position, away from the original biopsy  PMH:  has a past medical history of Breast calcification, right (01/2011).  PSH:   has past surgical history that includes Cesarean section (05/29/96, 03/30/00); Breast biopsy (10/1991); Breast biopsy (02/02/2011); and Breast lumpectomy (01/17/11).  ALLERGIES:  No Known Allergies  MEDICATIONS: Current outpatient prescriptions:[name] 3-0.02 MG tablet, 1 tablet daily. PM, Disp: , Rfl:   ROS: She has filled out our 12 point review of systems and it is negative. EXAM:   VS: BP 118/68  Pulse 74  Temp 98 F (36.7 C) (Oral)  Resp 18  Ht 5\' 5"  (1.651 m)  Wt 127 lb (57.607 kg)  BMI 21.13 kg/m2 General: The patient is alert, oreinted, NAD Breasts: Symmetric, well healed scar in UOQ right. Well circmscibed mass in the 7:30 position at the areolar edge. No other mass. Breasts dense bilaterally. Not tender. Lymphatics: NO axillary or supraclavicular adenopathy noted  DATA REVIEWED:  Ultrasound in our office shows benign appearing cyst. Old son at breast center showed cyst at same  locale,    Currie Paris 10/13/2011  CC: Levi Aland, MD, Levi Aland, MD

## 2011-10-13 NOTE — Patient Instructions (Signed)
Continue breast self exams and annual mammograms. See me again if any new problems

## 2011-10-27 ENCOUNTER — Encounter (INDEPENDENT_AMBULATORY_CARE_PROVIDER_SITE_OTHER): Payer: BC Managed Care – PPO | Admitting: Surgery

## 2011-12-26 ENCOUNTER — Other Ambulatory Visit: Payer: Self-pay | Admitting: Obstetrics and Gynecology

## 2012-12-30 ENCOUNTER — Other Ambulatory Visit: Payer: Self-pay | Admitting: Obstetrics and Gynecology

## 2014-01-13 ENCOUNTER — Other Ambulatory Visit: Payer: Self-pay | Admitting: Obstetrics and Gynecology

## 2014-01-14 LAB — CYTOLOGY - PAP

## 2014-09-24 ENCOUNTER — Institutional Professional Consult (permissible substitution): Payer: BLUE CROSS/BLUE SHIELD | Admitting: Neurology

## 2014-10-19 ENCOUNTER — Encounter: Payer: Self-pay | Admitting: Neurology

## 2014-10-19 ENCOUNTER — Ambulatory Visit (INDEPENDENT_AMBULATORY_CARE_PROVIDER_SITE_OTHER): Payer: BLUE CROSS/BLUE SHIELD | Admitting: Neurology

## 2014-10-19 VITALS — BP 100/60 | HR 72 | Resp 20 | Ht 65.0 in | Wt 130.0 lb

## 2014-10-19 DIAGNOSIS — F515 Nightmare disorder: Secondary | ICD-10-CM

## 2014-10-19 DIAGNOSIS — G4750 Parasomnia, unspecified: Secondary | ICD-10-CM

## 2014-10-19 DIAGNOSIS — K582 Mixed irritable bowel syndrome: Secondary | ICD-10-CM | POA: Diagnosis not present

## 2014-10-19 DIAGNOSIS — G478 Other sleep disorders: Secondary | ICD-10-CM | POA: Diagnosis not present

## 2014-10-19 MED ORDER — MELATONIN 10 MG PO TABS: 10.0000 mg | ORAL_TABLET | Freq: Every evening | ORAL | Status: DC

## 2014-10-19 NOTE — Patient Instructions (Signed)
Keep a sleep diary. Document the effects ( or not / of Melatonin ) . If this supresses the dreams, you need not to proceed to prescription drugs. If melatonin alone taken 30 minutes before intended bedtime does not suppress the sleep activity in the next 3 months I would like to offer you Klonopin as a prescription medication.  Clonazepam tablets What is this medicine? CLONAZEPAM (kloe NA ze pam) is a benzodiazepine. It is used to treat certain types of seizures. It is also used to treat panic disorder. This medicine may be used for other purposes; ask your health care provider or pharmacist if you have questions. What should I tell my health care provider before I take this medicine? They need to know if you have any of these conditions: -an alcohol or drug abuse problem -bipolar disorder, depression, psychosis or other mental health condition -glaucoma -kidney or liver disease -lung or breathing disease -myasthenia gravis -Parkinson's disease -porphyria -seizures or a history of seizures -suicidal thoughts -an unusual or allergic reaction to clonazepam, other benzodiazepines, foods, dyes, or preservatives -pregnant or trying to get pregnant -breast-feeding How should I use this medicine? Take this medicine by mouth with a glass of water. Follow the directions on the prescription label. If it upsets your stomach, take it with food or milk. Take your medicine at regular intervals. Do not take it more often than directed. Do not stop taking or change the dose except on the advice of your doctor or health care professional. A special MedGuide will be given to you by the pharmacist with each prescription and refill. Be sure to read this information carefully each time. Talk to your pediatrician regarding the use of this medicine in children. Special care may be needed. Overdosage: If you think you have taken too much of this medicine contact a poison control center or emergency room at  once. NOTE: This medicine is only for you. Do not share this medicine with others. What if I miss a dose? If you miss a dose, take it as soon as you can. If it is almost time for your next dose, take only that dose. Do not take double or extra doses. What may interact with this medicine? -herbal or dietary supplements -medicines for depression, anxiety, or psychotic disturbances -medicines for fungal infections like fluconazole, itraconazole, ketoconazole, voriconazole -medicines for HIV infection or AIDS -medicines for sleep -prescription pain medicines -propantheline -rifampin -sevelamer -some medicines for seizures like carbamazepine, phenobarbital, phenytoin, primidone This list may not describe all possible interactions. Give your health care provider a list of all the medicines, herbs, non-prescription drugs, or dietary supplements you use. Also tell them if you smoke, drink alcohol, or use illegal drugs. Some items may interact with your medicine. What should I watch for while using this medicine? Visit your doctor or health care professional for regular checks on your progress. Your body may become dependent on this medicine. If you have been taking this medicine regularly for some time, do not suddenly stop taking it. You must gradually reduce the dose or you may get severe side effects. Ask your doctor or health care professional for advice before increasing or decreasing the dose. Even after you stop taking this medicine it can still affect your body for several days. If you suffer from several types of seizures, this medicine may increase the chance of grand mal seizures (epilepsy). Let your doctor or health care professional know, he or she may want to prescribe an additional medicine. You  may get drowsy or dizzy. Do not drive, use machinery, or do anything that needs mental alertness until you know how this medicine affects you. To reduce the risk of dizzy and fainting spells, do not  stand or sit up quickly, especially if you are an older patient. Alcohol may increase dizziness and drowsiness. Avoid alcoholic drinks. Do not treat yourself for coughs, colds or allergies without asking your doctor or health care professional for advice. Some ingredients can increase possible side effects. The use of this medicine may increase the chance of suicidal thoughts or actions. Pay special attention to how you are responding while on this medicine. Any worsening of mood, or thoughts of suicide or dying should be reported to your health care professional right away. Women who become pregnant while using this medicine may enroll in the [place] Pregnancy Registry by calling [phone]. This registry collects information about the safety of antiepileptic drug use during pregnancy. What side effects may I notice from receiving this medicine? Side effects that you should report to your doctor or health care professional as soon as possible: -allergic reactions like skin rash, itching or hives, swelling of the face, lips, or tongue -changes in vision -confusion -depression -hallucinations -mood changes, excitability or aggressive behavior -movement difficulty, staggering or jerky movements -muscle cramps, weakness -tremors -unusual eye movements Side effects that usually do not require medical attention (report to your doctor or health care professional if they continue or are bothersome): -constipation or diarrhea -difficulty sleeping, nightmares -dizziness, drowsiness -headache -increased saliva from your mouth -nausea, vomiting This list may not describe all possible side effects. Call your doctor for medical advice about side effects. You may report side effects to FDA at 1-800-FDA-1088. Where should I keep my medicine? Keep out of the reach of children. This medicine can be abused. Keep your medicine in a safe place to protect it from theft. Do not share  this medicine with anyone. Selling or giving away this medicine is dangerous and against the law. This medicine may cause accidental overdose and death if taken by other adults, children, or pets. Mix any unused medicine with a substance like cat litter or coffee grounds. Then throw the medicine away in a sealed container like a sealed bag or a coffee can with a lid. Do not use the medicine after the expiration date. Store at room temperature between 15 and 30 degrees C (59 and 86 degrees F). Protect from light. Keep container tightly closed. NOTE: This sheet is a summary. It may not cover all possible information. If you have questions about this medicine, talk to your doctor, pharmacist, or health care provider.    2016, [employer]. (2014-04-07 14:01:43)

## 2014-10-19 NOTE — Progress Notes (Signed)
[facility]INE [facility]   Provider:  Larey Seat, M D  Referring Provider: Olga Millers, MD Primary Care Physician:  Ronita Hipps, MD  Chief Complaint  Patient presents with  . New Patient (Initial Visit)    sleep consult, acting out on dreams, rm 11, alone    HPI:  Renee Wright is a 44 y.o. female , seen here as a referral  from Dr. Ouida Sills for parasomnias. The patient is a former patient of Dr Tressia Danas.   Chief complaint according to patient : " I will see spiders or intruders at night and finally realize I was dreaming- I will wake up and realize this is a dream, and I remember the content"  The patient is a well  groomed and pleasant caucasian female, right handed, married with 2 children, who reports onset of sleep behaviors about 2 years ago, age 49.    Sleep habits are as follows: The patient reports that she usually goes to bed between 11 and 11:30 PM. She will fall asleep promptly and if she has one of her vivid dreams that night usually hassles with him the first 90 minutes of sleep. She has not woken up at 4 AM or later with similar complaints. During the week she has to arise at around 6:30 and usually feels refreshed or restored from her sleep. She does usually not wake with headaches or dry mouth. She is not known to snore and no apneas have been witnessed or reported to her. She does not have nocturia. She usually gets about 7 hours of nocturnal sleep and feels that the quality of her sleep is sufficient. She prefers to sleep on her side and describes her bedroom is cool, quiet and dark. She shares a bedroom with her husband. No pets in the bedroom.  She wakes up with palpitations sometimes diaphoretic as the dreams of vivid and threatening. As I eluded above she often has a vision of an intruder or a spider being in the bedroom. She has crawled over her husband still escapes a spider ones, but she is not known to kick was rash or blocks. She is also not set up in  bed and yelled loudly. At one time she had injured her head falling out of bed and onto a nightstand this was while at vacation in [place]. It is typically that injuries occur when the patient is in unfamiliar surroundings and she is aware of this. She had a brain MRI at the time that was regarded as normal. She is acting out dreams to some degree but not in a violent fashion.  One time she dreamt that the bedroom's wall was on fire or " glowing " but she soon realized and became aware of her real environment and her dreams ended she was fine and could go back to sleep. She also remembers her dreams usually which is unusual for sleep walking disorder.  The occurrence is not nightly but perhaps twice a month. But she has woken with physiological signs of distress such as diaphoresis and palpitations. She is not excessively daytime sleepy and she reports that her Epworth sleepiness score of only 5 points. She has also not experienced any sleep paralysis, she does usually not have the irresistible urge to go to sleep and does not nap on regular days. Her fatigue score is also low at 20 points. She reports no cataplectic attacks, dream intrusion or hypnagogic-hypnopompic hallucinations.  Sleep medical history and family sleep history:  No family history . A paternal aunt suffers from Parkinson's disease, a brother was born with hydrocephalus, the brother tested negative for obstructive sleep apnea.  Social history: The patient is a nonsmoker, she rarely drinks alcohol less than 2-3 beverages per month, she does not use any illegal drugs. She will drink a small cup of coffee in the morning and usually a glass of iced tea during the day. No caffeine intake at dinnertime.  Review of Systems: Out of a complete 14 system review, the patient complains of only the following symptoms, and all other reviewed systems are negative.   Epworth score 5 , Fatigue severity score 22  , depression score N/A     Social History   Social History  . Marital Status: Married    Spouse Name: N/A  . Number of Children: N/A  . Years of Education: N/A   Occupational History  . [employer] History Main Topics  . Smoking status: Never Smoker   . Smokeless tobacco: Never Used  . Alcohol Use: No  . Drug Use: No  . Sexual Activity: Not on file   Other Topics Concern  . Not on file   Social History Narrative   Married, 2 children.      Drinks one cup of caffeine daily.    Family History  Problem Relation Age of Onset  . Breast cancer Maternal Aunt   . Cancer Maternal Aunt     breast  . Uterine cancer Mother   . Cancer Mother     uterine    Past Medical History  Diagnosis Date  . Breast calcification, right 01/2011  . Breast cyst 01/13/2011    Right breast at 7:30 areolar edge   . IBS (irritable bowel syndrome)     Past Surgical History  Procedure Laterality Date  . Cesarean section  05/29/96, 03/30/00  . Breast biopsy  10/1991  . Breast biopsy  02/02/2011    Procedure: BREAST BIOPSY WITH NEEDLE LOCALIZATION;  Surgeon: Haywood Lasso, MD;  Location: [facility];  Service: General;  Laterality: Right;  removal of right breast calcifications  . Breast lumpectomy  01/17/11    benign- right breast (NCB)    Current Outpatient Prescriptions  Medication Sig Dispense Refill  . ergocalciferol (VITAMIN D2) 50000 UNITS capsule Take 50,000 Units by mouth once a week.    Marland Kitchen [name] 3-0.02 MG tablet 1 tablet daily. PM     No current facility-administered medications for this visit.    Allergies as of 10/19/2014  . (No Known Allergies)    Vitals: BP 100/60 mmHg  Pulse 72  Resp 20  Ht 5\' 5"  (1.651 m)  Wt 130 lb (58.968 kg)  BMI 21.63 kg/m2 Last Weight:  Wt Readings from Last 1 Encounters:  10/19/14 130 lb (58.968 kg)   PPJ:KDTO mass index is 21.63 kg/(m^2).     Last Height:   Ht Readings from Last 1 Encounters:  10/19/14 5\' 5"  (1.651 m)     Physical exam:  General: The patient is awake, alert and appears not in acute distress. The patient is well groomed. Head: Normocephalic, atraumatic. Neck is supple. Mallampati 2,  neck circumference:14. Nasal airflow unrestricted TMJ is not evident . Retrognathia is seen.  Cardiovascular: Regular rate and rhythm without  murmurs or carotid bruit, and without distended neck veins. Respiratory: Lungs are clear to auscultation. Skin:  Without evidence of edema, or rash Trunk: BMI and posture is normal.  Neurologic exam : The patient is awake and alert, oriented to place and time.   Memory subjective  described as intact.  Attention span & concentration ability appears normal.  Speech is fluent,  without dysarthria, dysphonia or aphasia.  Mood and affect are appropriate.  Cranial nerves: Pupils are equal and briskly reactive to light.  Funduscopic exam without evidence of pallor or edema. Extraocular movements  in vertical and horizontal planes intact and without nystagmus. Visual fields by finger perimetry are intact. Hearing to finger rub intact.   Facial sensation intact to fine touch.  Facial motor strength is symmetric and tongue and uvula move midline. Shoulder shrug was symmetrical.   Motor exam:   Normal tone, muscle bulk and symmetric strength in all extremities.  Sensory:  Fine touch, pinprick and vibration were tested in all extremities. Proprioception tested in the upper extremities was normal.  Coordination: Rapid alternating movements in the fingers/hands was normal. Finger-to-nose maneuver  normal without evidence of ataxia, dysmetria or tremor.  Gait and station: Patient walks without assistive device and is able unassisted to climb up to the exam table. Strength within normal limits.  Stance is stable and normal.  Toe and hell stand were tested .Tandem gait is unfragmented. Turns with  3  Steps. Romberg testing is  negative.  Deep tendon reflexes: in the  upper  and lower extremities are symmetric and intact. Babinski maneuver response is downgoing.  The patient was advised of the nature of the diagnosed sleep disorder , the treatment options and risks for general a health and wellness arising from not treating the condition.  I spent more than 50 minutes of face to face time with the patient. Greater than 50% of time was spent in counseling and coordination of care. We have discussed the diagnosis and differential and I answered the patient's questions.  The patient and her husband had already investigated different forms of parasomnia through the Internet and were concerned that the patient may suffer from a REM sleep behavior disorder which could be an early marker  to later onset of Parkinson's symptoms. My physical exam included the eye movements the muscle tone of biceps and wrist and her range of motion for the neck. There was no parkinsonian features found.   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 She has had the onset of these symptoms about 2 years ago and there was no medical condition / medication that may have triggered this.  In addition the early night onset is more typical for a slow wave sleep parasomnia.  That she usually doesn't leave the bed is more typical for a REM sleep behavior.  She is literally between the 2 definitions. She is also not excessively daytime sleepy or fatigued ; so narcolepsy or cataplexy certainly can be ruled out.    Plan:  Treatment plan and additional workup :  A sleep study would give very little yield for patient who has a parasomnia once or twice a month. I think it is better to use a sleep diary and follow the  pattern also to identify if there are trigger factors including nutrition, caffeine, alcohol intake etc. This medication became then see if this pattern further changes. I do not  order an imaging study of the brain because the patient had one in the last 18 months that was regarded as normal.  She has no history of a seizure disorder and certainly had only one event that may come classified as amnestic which was what led to her fall out of bed when she was in a [place]. What I would like to do is to give her a trial of low-dose Klonopin if she is concerned about a benzodiazepine I would asked her to use melatonin at night. Often melatonin alone is enough in REM behavior disorder to depress the  behavior.  Klonopin can be used when she travels or is in a foreign environment and will help her with accident prevention. There are no parkinsonian features/ symptoms.     Asencion Partridge Branden Vine MD  10/19/2014   CC: Olga Millers, [address], [place] [id number]

## 2015-01-10 DIAGNOSIS — Z923 Personal history of irradiation: Secondary | ICD-10-CM

## 2015-01-10 HISTORY — DX: Personal history of irradiation: Z92.3

## 2015-01-19 ENCOUNTER — Ambulatory Visit: Payer: BLUE CROSS/BLUE SHIELD | Admitting: Neurology

## 2015-03-02 ENCOUNTER — Other Ambulatory Visit: Payer: Self-pay | Admitting: Obstetrics and Gynecology

## 2015-03-03 LAB — CYTOLOGY - PAP

## 2015-03-05 ENCOUNTER — Other Ambulatory Visit: Payer: Self-pay | Admitting: Obstetrics and Gynecology

## 2015-03-05 DIAGNOSIS — R928 Other abnormal and inconclusive findings on diagnostic imaging of breast: Secondary | ICD-10-CM

## 2015-03-15 ENCOUNTER — Other Ambulatory Visit: Payer: Self-pay | Admitting: Obstetrics and Gynecology

## 2015-03-15 ENCOUNTER — Ambulatory Visit
Admission: RE | Admit: 2015-03-15 | Discharge: 2015-03-15 | Disposition: A | Discharge status: Home / Self Care | Payer: BLUE CROSS/BLUE SHIELD | Source: Ambulatory Visit | Attending: Obstetrics and Gynecology | Admitting: Obstetrics and Gynecology

## 2015-03-15 DIAGNOSIS — R928 Other abnormal and inconclusive findings on diagnostic imaging of breast: Secondary | ICD-10-CM

## 2015-03-20 HISTORY — PX: BREAST BIOPSY: SHX20

## 2015-03-25 ENCOUNTER — Other Ambulatory Visit: Payer: Self-pay | Admitting: Obstetrics and Gynecology

## 2015-03-25 ENCOUNTER — Ambulatory Visit
Admission: RE | Admit: 2015-03-25 | Discharge: 2015-03-25 | Disposition: A | Discharge status: Home / Self Care | Payer: BLUE CROSS/BLUE SHIELD | Source: Ambulatory Visit | Attending: Obstetrics and Gynecology | Admitting: Obstetrics and Gynecology

## 2015-03-25 DIAGNOSIS — R928 Other abnormal and inconclusive findings on diagnostic imaging of breast: Secondary | ICD-10-CM

## 2015-03-25 DIAGNOSIS — N6310 Unspecified lump in the right breast, unspecified quadrant: Secondary | ICD-10-CM

## 2015-03-26 ENCOUNTER — Encounter: Payer: Self-pay | Admitting: *Deleted

## 2015-03-26 ENCOUNTER — Telehealth: Payer: Self-pay | Admitting: *Deleted

## 2015-03-26 DIAGNOSIS — C50211 Malignant neoplasm of upper-inner quadrant of right female breast: Secondary | ICD-10-CM

## 2015-03-26 DIAGNOSIS — Z170 Estrogen receptor positive status [ER+]: Secondary | ICD-10-CM | POA: Insufficient documentation

## 2015-03-26 HISTORY — DX: Malignant neoplasm of upper-inner quadrant of right female breast: C50.211

## 2015-03-26 NOTE — Telephone Encounter (Signed)
Confirmed [facility] for 03/31/15 at 1215.  Instructions and contact information given.

## 2015-03-26 NOTE — Telephone Encounter (Signed)
Mailed clinic packet to pt.  

## 2015-03-29 ENCOUNTER — Other Ambulatory Visit: Payer: BLUE CROSS/BLUE SHIELD

## 2015-03-31 ENCOUNTER — Other Ambulatory Visit (HOSPITAL_BASED_OUTPATIENT_CLINIC_OR_DEPARTMENT_OTHER): Payer: BLUE CROSS/BLUE SHIELD

## 2015-03-31 ENCOUNTER — Ambulatory Visit
Admission: RE | Admit: 2015-03-31 | Discharge: 2015-03-31 | Disposition: A | Discharge status: Home / Self Care | Payer: BLUE CROSS/BLUE SHIELD | Source: Ambulatory Visit | Attending: Radiation Oncology | Admitting: Radiation Oncology

## 2015-03-31 ENCOUNTER — Telehealth: Payer: Self-pay | Admitting: Oncology

## 2015-03-31 ENCOUNTER — Ambulatory Visit: Payer: BLUE CROSS/BLUE SHIELD | Attending: Surgery | Admitting: Physical Therapy

## 2015-03-31 ENCOUNTER — Ambulatory Visit (HOSPITAL_BASED_OUTPATIENT_CLINIC_OR_DEPARTMENT_OTHER): Payer: BLUE CROSS/BLUE SHIELD | Admitting: Oncology

## 2015-03-31 ENCOUNTER — Encounter: Payer: Self-pay | Admitting: Oncology

## 2015-03-31 ENCOUNTER — Encounter: Payer: Self-pay | Admitting: Physical Therapy

## 2015-03-31 ENCOUNTER — Encounter: Payer: Self-pay | Admitting: Nurse Practitioner

## 2015-03-31 VITALS — BP 136/62 | HR 66 | Temp 98.2°F | Resp 18 | Ht 65.0 in | Wt 128.5 lb

## 2015-03-31 DIAGNOSIS — Z170 Estrogen receptor positive status [ER+]: Secondary | ICD-10-CM

## 2015-03-31 DIAGNOSIS — C50211 Malignant neoplasm of upper-inner quadrant of right female breast: Secondary | ICD-10-CM

## 2015-03-31 DIAGNOSIS — C50811 Malignant neoplasm of overlapping sites of right female breast: Secondary | ICD-10-CM

## 2015-03-31 LAB — COMPREHENSIVE METABOLIC PANEL
ALBUMIN: 3.5 g/dL (ref 3.5–5.0)
ALK PHOS: 49 U/L (ref 40–150)
ALT: <9 U/L (ref 0–55)
AST: 27 U/L (ref 5–34)
Anion Gap: 7 mEq/L (ref 3–11)
BILIRUBIN TOTAL: 0.74 mg/dL (ref 0.20–1.20)
BUN: 9.8 mg/dL (ref 7.0–26.0)
CO2: 26 mEq/L (ref 22–29)
Calcium: 9.2 mg/dL (ref 8.4–10.4)
Chloride: 108 mEq/L (ref 98–109)
Creatinine: 0.9 mg/dL (ref 0.6–1.1)
EGFR: 81 mL/min/{1.73_m2} — AB (ref >90)
GLUCOSE: 89 mg/dL (ref 70–140)
Potassium: 3.7 mEq/L (ref 3.5–5.1)
SODIUM: 141 meq/L (ref 136–145)
TOTAL PROTEIN: 7 g/dL (ref 6.4–8.3)

## 2015-03-31 LAB — CBC WITH DIFFERENTIAL/PLATELET
BASO%: 0.6 % (ref 0.0–2.0)
Basophils Absolute: 0 10*3/uL (ref 0.0–0.1)
EOS ABS: 0 10*3/uL (ref 0.0–0.5)
EOS%: 0.4 % (ref 0.0–7.0)
HEMATOCRIT: 39.3 % (ref 34.8–46.6)
HEMOGLOBIN: 12.8 g/dL (ref 11.6–15.9)
LYMPH#: 1.5 10*3/uL (ref 0.9–3.3)
LYMPH%: 27 % (ref 14.0–49.7)
MCH: 28 pg (ref 25.1–34.0)
MCHC: 32.6 g/dL (ref 31.5–36.0)
MCV: 85.7 fL (ref 79.5–101.0)
MONO#: 0.5 10*3/uL (ref 0.1–0.9)
MONO%: 8.9 % (ref 0.0–14.0)
NEUT%: 63.1 % (ref 38.4–76.8)
NEUTROS ABS: 3.5 10*3/uL (ref 1.5–6.5)
Platelets: 219 10*3/uL (ref 145–400)
RBC: 4.58 10*6/uL (ref 3.70–5.45)
RDW: 13.5 % (ref 11.2–14.5)
WBC: 5.6 10*3/uL (ref 3.9–10.3)

## 2015-03-31 NOTE — Patient Instructions (Signed)

## 2015-03-31 NOTE — Progress Notes (Signed)
Radiation Oncology         [phone] ________________________________  Initial outpatient Consultation  Name: Renee Wright MRN: [id number]  Date: 03/31/2015  DOB: [birth year]  OJ:JKKX,FGHWEX S, MD  Erroll Luna, MD   REFERRING PHYSICIAN: Erroll Luna, MD  DIAGNOSIS:    ICD-9-CM ICD-10-CM   1. Breast cancer of upper-inner quadrant of right female breast ([place]) 174.2 C50.211    Stage IA [id number] Right Breast UIQ Invasive Ductal Carcinoma w/ DCIS, ER+ / PR+ / Her2neg, Grade 1  HISTORY OF PRESENT ILLNESS::Renee Wright is a 45 y.o. female who presented with a right breast mass on screening mammogram.  The mass was 68m on [place]   Biopsy showed IDC and DCIS with characteristics as described above in the diagnosis.  She is in her USOH otherwise.  She does have a contributory family history and will undergo genetic testing. She developed a rash after her biopsy over her right breast.  PAST MEDICAL HISTORY:  has a past medical history of Breast calcification, right (01/2011); Breast cyst (01/13/2011); IBS (irritable bowel syndrome); Breast cancer of upper-inner quadrant of right female breast ([facility] (03/26/2015); and Breast cancer ([place].    PAST SURGICAL HISTORY: Past Surgical History  Procedure Laterality Date  . Cesarean section  05/29/96, 03/30/00  . Breast biopsy  10/1991  . Breast biopsy  02/02/2011    Procedure: BREAST BIOPSY WITH NEEDLE LOCALIZATION;  Surgeon: CHaywood Lasso MD;  [facility]  Service: General;  Laterality: Right;  removal of right breast calcifications  . Breast lumpectomy  01/17/11    benign- right breast ([facility])    FAMILY HISTORY: family history includes Breast cancer in her maternal aunt; Cancer in her maternal aunt and mother; Colon cancer in her paternal grandmother; Melanoma in her maternal grandmother; Uterine cancer in her mother.  SOCIAL HISTORY:  reports that she has never smoked. She has never used smokeless tobacco. She  reports that she drinks alcohol. She reports that she does not use illicit drugs.  ALLERGIES: Review of patient's allergies indicates no known allergies.  MEDICATIONS:  Current Outpatient Prescriptions  Medication Sig Dispense Refill  . ergocalciferol (VITAMIN D2) 50000 UNITS capsule Take 50,000 Units by mouth once a week. Reported on 03/31/2015    . [name] 3-0.02 MG tablet 1 tablet daily. PM    . Melatonin 10 MG TABS Take 10 mg by mouth Nightly. (Patient not taking: Reported on 03/31/2015) 30 tablet 5   No current facility-administered medications for this encounter.    REVIEW OF SYSTEMS:  Notable for that above.   PHYSICAL EXAM:    Vitals with BMI 03/31/2015  Height '5\' 5"'   Weight 128 lbs 8 oz  BMI 293.7 Systolic 1169 Diastolic 62  Pulse 66  Respirations 18   General: Alert and oriented, in no acute distress HEENT: Head is normocephalic.  Neck: Neck is supple, no palpable cervical or supraclavicular lymphadenopathy. Heart: Regular in rate and rhythm with no murmurs, rubs, or gallops. Chest: Clear to auscultation bilaterally, with no rhonchi, wheezes, or rales. Abdomen: Soft, nontender, nondistended, with no rigidity or guarding. Extremities: No cyanosis or edema. Lymphatics: see Neck Exam Skin: No concerning lesions.  Non-malignant appearing rash over right breast (reactive in nature, erythematous) Musculoskeletal: symmetric strength and muscle tone throughout. Neurologic: Cranial nerves II through XII are grossly intact. No obvious focalities. Speech is fluent. Coordination is intact. Psychiatric: Judgment and insight are intact. Affect is appropriate. Breasts: slight thicenking (<1cm) in upper right  breast at biopsy site.  No other palpable masses appreciated in the breasts or axillae bilaterally    ECOG = 0  0 - Asymptomatic (Fully active, able to carry on all predisease activities without restriction)  1 - Symptomatic but completely ambulatory (Restricted in  physically strenuous activity but ambulatory and able to carry out work of a light or sedentary nature. For example, light housework, office work)  2 - Symptomatic, <50% in bed during the day (Ambulatory and capable of all self care but unable to carry out any work activities. Up and about more than 50% of waking hours)  3 - Symptomatic, >50% in bed, but not bedbound (Capable of only limited self-care, confined to bed or chair 50% or more of waking hours)  4 - Bedbound (Completely disabled. Cannot carry on any self-care. Totally confined to bed or chair)  5 - Death   Eustace Pen MM, Creech RH, Tormey DC, et al. [phone]). "Toxicity and response criteria of the [facility]". [place] Oncol. 5 (6): 649-55   LABORATORY DATA:  Lab Results  Component Value Date   WBC 5.6 03/31/2015   HGB 12.8 03/31/2015   HCT 39.3 03/31/2015   MCV 85.7 03/31/2015   PLT 219 03/31/2015   CMP     Component Value Date/Time   NA 141 03/31/2015 1218   K 3.7 03/31/2015 1218   CO2 26 03/31/2015 1218   GLUCOSE 89 03/31/2015 1218   BUN 9.8 03/31/2015 1218   CREATININE 0.9 03/31/2015 1218   CALCIUM 9.2 03/31/2015 1218   PROT 7.0 03/31/2015 1218   ALBUMIN 3.5 03/31/2015 1218   AST 27 03/31/2015 1218   ALT <9 03/31/2015 1218   ALKPHOS 49 03/31/2015 1218   BILITOT 0.74 03/31/2015 1218         RADIOGRAPHY: Mm Digital Diagnostic Unilat R  03/25/2015  CLINICAL DATA:  45 year old female status post ultrasound-guided right breast biopsy EXAM: DIAGNOSTIC RIGHT MAMMOGRAM POST ULTRASOUND BIOPSY COMPARISON:  Previous exam(s). FINDINGS: Mammographic images were obtained following ultrasound guided biopsy of a suspicious right breast mass at 12:30, 9 cm from the nipple. Post biopsy mammogram demonstrates the coil shaped biopsy marker to be in the expected location within the superior right breast on the lateral view. The biopsied mass is not visualized on the CC view due to its superior and posterior  location. IMPRESSION: Satisfactory marker placement post ultrasound-guided right breast biopsy. Final Assessment: Post Procedure Mammograms for Marker Placement Electronically Signed   By: Pamelia Hoit M.D.   On: 03/25/2015 15:53   US Breast Ltd Uni Right Inc Axilla  03/15/2015  CLINICAL DATA:  Patient returns today to evaluate a possible right breast mass identified on recent screening mammogram. EXAM: DIGITAL DIAGNOSTIC RIGHT MAMMOGRAM WITH 3D TOMOSYNTHESIS WITH CAD ULTRASOUND RIGHT BREAST COMPARISON:  Previous exams including recent screening mammogram dated 03/02/2015. ACR Breast Density Category c: The breast tissue is heterogeneously dense, which may obscure small masses. FINDINGS: On today's additional views of the right breast with 3D tomosynthesis, there is an irregular mass confirmed within the upper right breast, at posterior depth, measuring 6 mm, with associated microcalcifications, only seen on the MLO projection, 12-1 o'clock axis region based on tomosynthesis slice position. Mammographic images were processed with CAD. Targeted ultrasound is performed, showing an irregular hypoechoic mass within the right breast at the 12:30 o'clock axis, 9 cm from the nipple, at posterior depth, measuring 0.6 x 0.4 x 0.5 cm. Right axilla was also evaluated with ultrasound showing  no enlarged or morphologically abnormal lymph nodes. IMPRESSION: Irregular mass within the right breast at the 12:30 o'clock axis, 9 cm from the nipple, at posterior depth, measuring 0.6 x 0.4 x 0.5 cm, corresponding to the mammographic finding. Configuration on ultrasound is somewhat suggestive of lymph node, however, the irregular margins on mammogram and the associated microcalcifications are concerning for a primary breast cancer. This is a suspicious finding for which ultrasound-guided biopsy is recommended. RECOMMENDATION: Ultrasound-guided biopsy of the right breast mass. Postprocedure mammogram to ensure sonographic and mammographic  correlation. Ultrasound-guided biopsy is scheduled for 03/29/2015 at 2 p.m. I have discussed the findings and recommendations with the patient. Results were also provided in writing at the conclusion of the visit. If applicable, a reminder letter will be sent to the patient regarding the next appointment. BI-RADS CATEGORY  4: Suspicious. Electronically Signed   By: Franki Cabot M.D.   On: 03/15/2015 17:18   Mm Diag Breast Tomo Uni Right  03/15/2015  CLINICAL DATA:  Patient returns today to evaluate a possible right breast mass identified on recent screening mammogram. EXAM: DIGITAL DIAGNOSTIC RIGHT MAMMOGRAM WITH 3D TOMOSYNTHESIS WITH CAD ULTRASOUND RIGHT BREAST COMPARISON:  Previous exams including recent screening mammogram dated 03/02/2015. ACR Breast Density Category c: The breast tissue is heterogeneously dense, which may obscure small masses. FINDINGS: On today's additional views of the right breast with 3D tomosynthesis, there is an irregular mass confirmed within the upper right breast, at posterior depth, measuring 6 mm, with associated microcalcifications, only seen on the MLO projection, 12-1 o'clock axis region based on tomosynthesis slice position. Mammographic images were processed with CAD. Targeted ultrasound is performed, showing an irregular hypoechoic mass within the right breast at the 12:30 o'clock axis, 9 cm from the nipple, at posterior depth, measuring 0.6 x 0.4 x 0.5 cm. Right axilla was also evaluated with ultrasound showing no enlarged or morphologically abnormal lymph nodes. IMPRESSION: Irregular mass within the right breast at the 12:30 o'clock axis, 9 cm from the nipple, at posterior depth, measuring 0.6 x 0.4 x 0.5 cm, corresponding to the mammographic finding. Configuration on ultrasound is somewhat suggestive of lymph node, however, the irregular margins on mammogram and the associated microcalcifications are concerning for a primary breast cancer. This is a suspicious finding for  which ultrasound-guided biopsy is recommended. RECOMMENDATION: Ultrasound-guided biopsy of the right breast mass. Postprocedure mammogram to ensure sonographic and mammographic correlation. Ultrasound-guided biopsy is scheduled for 03/29/2015 at 2 p.m. I have discussed the findings and recommendations with the patient. Results were also provided in writing at the conclusion of the visit. If applicable, a reminder letter will be sent to the patient regarding the next appointment. BI-RADS CATEGORY  4: Suspicious. Electronically Signed   By: Franki Cabot M.D.   On: 03/15/2015 17:18   [place] Rt Breast Bx W Loc Dev 1st Lesion Img Bx Spec US Guide  03/29/2015  ADDENDUM REPORT: 03/26/2015 11:10 ADDENDUM: Pathology revealed GRADE I INVASIVE DUCTAL CARCINOMA, DUCTAL CARCINOMA IN SITU of the Right breast at the 12:30 o'clock location. This was found to be concordant by Dr. Pamelia Hoit. Pathology results were discussed with the patient by telephone. The patient reported doing well after the biopsy. Post biopsy instructions and care were reviewed and questions were answered. The patient was encouraged to call [facility] for any additional concerns. The patient was referred to [facility] on March 31, 2015. Pathology results reported  by Terie Purser, RN on 03/26/2015. Electronically Signed   By: Pamelia Hoit M.D.   On: 03/26/2015 11:10  03/29/2015  CLINICAL DATA:  45 year old female for ultrasound-guided biopsy of a suspicious right breast mass at 12:30 EXAM: ULTRASOUND GUIDED RIGHT BREAST CORE NEEDLE BIOPSY COMPARISON:  Previous exam(s). FINDINGS: I met with the patient and we discussed the procedure of ultrasound-guided biopsy, including benefits and alternatives. We discussed the high likelihood of a successful procedure. We discussed the risks of the procedure, including infection, bleeding, tissue injury, clip migration, and  inadequate sampling. Informed written consent was given. The usual time-out protocol was performed immediately prior to the procedure. Using sterile technique and 2% Lidocaine as local anesthetic, under direct ultrasound visualization, a 12 gauge spring-loaded device was used to perform biopsy of a suspicious right breast mass using a lateral to medial approach. At the conclusion of the procedure a coil shaped tissue marker clip was deployed into the biopsy cavity. Follow up 2 view mammogram was performed and dictated separately. IMPRESSION: Ultrasound guided biopsy of a suspicious right breast mass at 12:30. No apparent complications. Electronically Signed: By: Pamelia Hoit M.D. On: 03/25/2015 15:47      IMPRESSION/PLAN: Stage I right breast cancer, ER+  She has been discussed at our multidisciplinary tumor board.  The consensus is that she be a good candidate would for breast conservation. I talked to her about the option of a mastectomy and informed her that her expected overall survival would be equivalent between mastectomy and breast conservation, based upon randomized controlled data. She is interested in breast conservation. But, would like to see her genetic testing results first.  She would also like to speak with plastic surgery to aid in her decision. She has relatively small breasts.   Genetics recommended and ordered.  Plastics consult ordered.  Oncotype testing recommended post operatively.  It was a pleasure meeting the patient today. We discussed the risks, benefits, and side effects of radiotherapy. I recommend radiotherapy to the right breast if she undergoes a lumpectomy to reduce her risk of locoregional recurrence by 2/3.  We discussed that radiation would take approximately 4-6 weeks to complete and that I would give the patient a few weeks to heal following surgery before starting treatment planning.   If chemotherapy were to be given, this would precede radiotherapy. We spoke about  acute effects including skin irritation and fatigue as well as much less common late effects including internal organ injury or irritation. We spoke about the latest technology that is used to minimize the risk of late effects for patients undergoing radiotherapy to the breast or chest wall. No guarantees of treatment were given.   She would like her radiotherapy followup and treatment in [place].  I asked our navigators to refer her to Dr Orlene Erm.      __________________________________________   Eppie Gibson, MD

## 2015-03-31 NOTE — Progress Notes (Signed)
Renee Wright is a very pleasant 45 y.o. female from [place], [place] with newly diagnosed grade 1 invasive ductal carcinoma with DCIS of the right breast.  Biopsy results revealed the tumor's prognostic profile is ER positive, PR positive, and HER2/neu negative. Ki67 is 3%.  She presents today with her husband to the [facility]) for treatment consideration and recommendations from the breast surgeon, radiation oncologist, and medical oncologist.     I briefly met with Renee Wright and her husband during her [facility] visit today. We discussed the purpose of the Survivorship Clinic, which will include monitoring for recurrence, coordinating completion of age and gender-appropriate cancer screenings, promotion of overall wellness, as well as managing potential late/long-term side effects of anti-cancer treatments.    The treatment plan for Renee Wright will likely include surgery, radiation therapy, and anti-estrogen therapy.  She will meet with the Genetics Counselor due to her family history of breast cancer and age. As of today, the intent of treatment for Renee Wright is cure, therefore she will be eligible for the Survivorship Clinic upon her completion of treatment.  Her survivorship care plan (SCP) document will be drafted and updated throughout the course of her treatment trajectory. She will receive the SCP in an office visit with myself in the Survivorship Clinic once she has completed treatment.   Renee Wright was encouraged to ask questions and all questions were answered to her satisfaction.  She was given my business card and encouraged to contact me with any concerns regarding survivorship.  I look forward to participating in her care.   Kenn File, [place] [phone]

## 2015-03-31 NOTE — Telephone Encounter (Signed)
appt made and avs to be printed at end of visit

## 2015-03-31 NOTE — Therapy (Signed)
[place], [place], [address] Phone: [phone]   Fax:  [phone]  Physical Therapy Evaluation  Patient Details  Name: Renee Wright MRN: [id number] Date of Birth: March 13, 1970 Referring Provider: Dr. Erroll Luna  Encounter Date: 03/31/2015      PT End of Session - 03/31/15 1455    Visit Number 1   Number of Visits 1   PT Start Time 1500   PT Stop Time 1525   PT Time Calculation (min) 25 min   Activity Tolerance Patient tolerated treatment well   Behavior During Therapy [facility] for tasks assessed/performed      Past Medical History  Diagnosis Date  . Breast calcification, right 01/2011  . Breast cyst 01/13/2011    Right breast at 7:30 areolar edge   . IBS (irritable bowel syndrome)   . Breast cancer of upper-inner quadrant of right female breast ([place]) 03/26/2015  . Breast cancer [facility])     Past Surgical History  Procedure Laterality Date  . Cesarean section  05/29/96, 03/30/00  . Breast biopsy  10/1991  . Breast biopsy  02/02/2011    Procedure: BREAST BIOPSY WITH NEEDLE LOCALIZATION;  Surgeon: Haywood Lasso, MD;  Location: [facility];  Service: General;  Laterality: Right;  removal of right breast calcifications  . Breast lumpectomy  01/17/11    benign- right breast ([facility])    There were no vitals filed for this visit.  Visit Diagnosis:  Carcinoma of right breast upper inner quadrant [facility])      Subjective Assessment - 03/31/15 1521    Subjective Patient reports she was diagnosed with right breast cancer and is here today for a baseline assessment with her medical team.   Patient is accompained by: Family member   Pertinent History Patient was diagnosed on 03/02/15 with right grade 1 invasive ductal carcinoma with DCIS.  It measures 6 mm located in the upper inner quadrant, is ER/PR positive and HER2 negative.   Patient Stated Goals Reduce lymphedema risk and learn post op  shoulder ROM HEP   Currently in Pain? No/denies            [facility] PT Assessment - 03/31/15 0001    Assessment   Medical Diagnosis Right breast cancer   Referring Provider Dr. Marcello Moores [place]   Onset Date/Surgical Date 03/02/15   Hand Dominance Right   Prior Therapy none   Precautions   Precautions Other (comment)   Precaution Comments Active breast cancer   Restrictions   Weight Bearing Restrictions No   Balance Screen   Has the patient fallen in the past 6 months No   Has the patient had a decrease in activity level because of a fear of falling?  No   Is the patient reluctant to leave their home because of a fear of falling?  No   Home Environment   Living Environment Private residence   Living Arrangements Spouse/significant other;Children  Husband and teenage daughter   Available Help at Discharge Family   Prior Function   Level of Independence Independent   Vocation Full time employment   Public relations account executive in banking   Leisure She does not exercise   Cognition   Overall Cognitive Status Within Functional Limits for tasks assessed   Posture/Postural Control   Posture/Postural Control No significant limitations   ROM / Strength   AROM / PROM / Strength AROM;Strength   AROM   AROM Assessment Site Shoulder  Right/Left Shoulder Right;Left   Right Shoulder Extension 42 Degrees   Right Shoulder Flexion 158 Degrees   Right Shoulder ABduction 177 Degrees   Right Shoulder Internal Rotation 76 Degrees   Right Shoulder External Rotation 78 Degrees   Left Shoulder Extension 37 Degrees   Left Shoulder Flexion 152 Degrees   Left Shoulder ABduction 179 Degrees   Left Shoulder Internal Rotation 64 Degrees   Left Shoulder External Rotation 77 Degrees   Strength   Overall Strength Within functional limits for tasks performed           LYMPHEDEMA/ONCOLOGY QUESTIONNAIRE - 03/31/15 1524    Type   Cancer Type Right breast cancer   Lymphedema Assessments    Lymphedema Assessments Upper extremities   Right Upper Extremity Lymphedema   10 cm Proximal to Olecranon Process 24 cm   Olecranon Process 20.4 cm   10 cm Proximal to Ulnar Styloid Process 18 cm   Just Proximal to Ulnar Styloid Process 13.1 cm   Across Hand at [employer] 17.5 cm   At [place] of 2nd Digit 5.5 cm   Left Upper Extremity Lymphedema   10 cm Proximal to Olecranon Process 24.3 cm   Olecranon Process 20.8 cm   10 cm Proximal to Ulnar Styloid Process 17.6 cm   Just Proximal to Ulnar Styloid Process 12.6 cm   Across Hand at [employer] 17 cm   At [place] of 2nd Digit 5.3 cm      Patient was instructed today in a home exercise program today for post op shoulder range of motion. These included active assist shoulder flexion in sitting, scapular retraction, wall walking with shoulder abduction, and hands behind head external rotation.  She was encouraged to do these twice a day, holding 3 seconds and repeating 5 times when permitted by her physician.         PT Education - 03/31/15 1454    Education provided Yes   Education Details Lymphedema risk reduction and post op shoulder ROM HEP   Person(s) Educated Patient;Spouse   Methods Explanation;Demonstration;Handout   Comprehension Returned demonstration;Verbalized understanding              Breast Clinic Goals - 03/31/15 1458    Patient will be able to verbalize understanding of pertinent lymphedema risk reduction practices relevant to her diagnosis specifically related to skin care.   Time 1   Period Days   Status Achieved   Patient will be able to return demonstrate and/or verbalize understanding of the post-op home exercise program related to regaining shoulder range of motion.   Time 1   Period Days   Status Achieved   Patient will be able to verbalize understanding of the importance of attending the postoperative After Breast Cancer Class for further lymphedema risk reduction education and therapeutic  exercise.   Time 1   Period Days   Status Achieved              Plan - 03/31/15 1455    Clinical Impression Statement Patient was diagnosed on 03/02/15 with right grade 1 invasive ductal carcinoma with DCIS.  It measures 6 mm located in the upper inner quadrant, is ER/PR positive and HER2 negative.  She is planning to have a right lumpectomy and sentinel node biopsy followed by radiation and anti-estrogen therapy.  She may benefit from post op PT to regain shoulder ROM and strength and reduce lymphedema risk.   Pt will benefit from skilled therapeutic intervention in order  to improve on the following deficits Decreased strength;Decreased knowledge of precautions;Pain;Decreased range of motion;Impaired UE functional use   Rehab Potential Excellent   Clinical Impairments Affecting Rehab Potential none   PT Frequency One time visit   PT Treatment/Interventions Therapeutic exercise;Patient/family education   PT Next Visit Plan f/u after surgery   PT Home Exercise Plan Post op Shoulder ROM HEP   Consulted and Agree with Plan of Care Patient     Patient will follow up at outpatient cancer rehab if needed following surgery.  If the patient requires physical therapy at that time, a specific plan will be dictated and sent to the referring physician for approval. The patient was educated today on appropriate basic range of motion exercises to begin post operatively and the importance of attending the After Breast Cancer class following surgery.  Patient was educated today on lymphedema risk reduction practices as it pertains to recommendations that will benefit the patient immediately following surgery.  She verbalized good understanding.  No additional physical therapy is indicated at this time.       Problem List Patient Active Problem List   Diagnosis Date Noted  . Breast cancer of upper-inner quadrant of right female breast ([place]) 03/26/2015  . Parasomnia, organic 10/19/2014  . Nightmares  REM-sleep type 10/19/2014  . Irritable bowel syndrome with both constipation and diarrhea 10/19/2014  . Breast cyst 01/13/2011   Annia Friendly, PT 03/31/2015 3:32 PM  [place], [place], [address] Phone: [phone]   Fax:  [phone]  Name: Renee Wright MRN: [id number] Date of Birth: 1970/09/28

## 2015-03-31 NOTE — Progress Notes (Signed)
[place]  Telephone:(336) [phone] Fax:(336) [phone]     ID: Renee Wright DOB: 1970-05-04  MR#: [id number]  ION#:[id number]  Patient Care Team: Ronita Hipps, MD as PCP - General (Family Medicine) Olga Millers, MD as PCP - OBGYN (Obstetrics and Gynecology) Chauncey Cruel, MD as Consulting Physician (Oncology) Erroll Luna, MD as Consulting Physician (General Surgery) Eppie Gibson, MD as Attending Physician (Radiation Oncology) PCP: Ronita Hipps, MD OTHER MD:  CHIEF COMPLAINT: Estrogen receptor positive invasive breast cancer   CURRENT TREATMENT: Awaiting definitive surgery   BREAST CANCER HISTORY:  Renee Wright had screening mammography 03/02/2015 showing a possible change in her right breast. On 03/15/2015 she underwent right diagnostic mammography with tomosynthesis and right breast ultrasonography at the [facility]. Breast density was category C. Mammography confirmed an irregular mass in the upper right breast and draining 0.6 cm, with associated microcalcifications. Ultrasound confirmed an irregular hypoechoic mass at the 12:30 o'clock axis measuring 0.6 cm. The right axilla was sonographically normal.  Biopsy of the right breast mass in question 03/25/2015 showed ([id number]) invasive ductal carcinoma, grade 1, estrogen receptor 90% positive, progesterone receptor 90% positive, both with strong staining intensity, with an MIB-1 of 3%, and HER-2 nonamplified, the signals ratio being 0.9 to and the number per cell 1.75.  Her subsequent history is as detailed below  INTERVAL HISTORY: Renee Wright was evaluated in the multidisciplinary breast cancer clinic 03/31/2015 accompanied by her husband Renee Wright.Her case was also presented in the multidisciplinary breast cancer conference that same morning. At that time a preliminary plan was proposed: Breast conserving surgery with sentinel lymph node sampling, followed by adjuvant radiation. An Oncotype will be obtained from  the definitive surgical sample to clarify the chemotherapy question. Anti-estrogens will follow local treatment. The patient was also felt to warrant genetics testing  REVIEW OF SYSTEMS: There were no specific symptoms leading to the original mammogram, which was routinely scheduled. The patient denies unusual headaches, visual changes, nausea, vomiting, stiff neck, dizziness, or gait imbalance. There has been no cough, phlegm production, or pleurisy, no chest pain or pressure, and no change in bowel or bladder habits. The patient denies fever, rash, bleeding, unexplained fatigue or unexplained weight loss. She does not exercise regularly. A detailed review of systems was otherwise entirely negative.  PAST MEDICAL HISTORY: Past Medical History  Diagnosis Date  . Breast calcification, right 01/2011  . Breast cyst 01/13/2011    Right breast at 7:30 areolar edge   . IBS (irritable bowel syndrome)   . Breast cancer of upper-inner quadrant of right female breast ([place]) 03/26/2015  . Breast cancer ([place])     PAST SURGICAL HISTORY: Past Surgical History  Procedure Laterality Date  . Cesarean section  05/29/96, 03/30/00  . Breast biopsy  10/1991  . Breast biopsy  02/02/2011    Procedure: BREAST BIOPSY WITH NEEDLE LOCALIZATION;  Surgeon: Haywood Lasso, MD;  Location: [facility];  Service: General;  Laterality: Right;  removal of right breast calcifications  . Breast lumpectomy  01/17/11    benign- right breast ([facility])    FAMILY HISTORY Family History  Problem Relation Age of Onset  . Breast cancer Maternal Aunt   . Cancer Maternal Aunt     breast  . Uterine cancer Mother   . Cancer Mother     uterine  . Melanoma Maternal Grandmother   . Colon cancer Paternal Grandmother   The patient's parents are in their mid 75s as of March 2017.  The patient's mother was diagnosed with a rare form of uterine cancer at age 71. She underwent hysterectomy and apparently was cured, with no systemic  treatment. The patient's mother had 2 sisters, one of them was diagnosed with breast cancer at the age of 82. In addition the patient's maternal grandmother was diagnosed with melanoma at age 92. On the paternal side there is a history of colon cancer in the paternal grandmother at age 49+. There is no history of ovarian cancer in the family  GYNECOLOGIC HISTORY:  Patient's last menstrual period was 03/07/2015. Menarche age 49 first live birth age 45 the patient is [place] P2. She still having regular periods. She has been on oral contraceptives for 31 years. She was asked to discontinue these at the time of breast cancer diagnosis in March 2017.  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 Her husband Renee Wright is self-employed in Theme park manager. Daughter Renee Wright lives in [place] and is a Electronics engineer in business. Son Renee Wright also lives at home. He is in high school.    ADVANCED DIRECTIVES: Not in place   HEALTH MAINTENANCE: Social History  Substance Use Topics  . Smoking status: Never Smoker   . Smokeless tobacco: Never Used  . Alcohol Use: Yes     Colonoscopy:  JQB:HALPFXTK 2017  Bone density:  Lipid panel:  No Known Allergies  Current Outpatient Prescriptions  Medication Sig Dispense Refill  . [name] 3-0.02 MG tablet 1 tablet daily. PM    . ergocalciferol (VITAMIN D2) 50000 UNITS capsule Take 50,000 Units by mouth once a week. Reported on 03/31/2015    . Melatonin 10 MG TABS Take 10 mg by mouth Nightly. (Patient not taking: Reported on 03/31/2015) 30 tablet 5   No current facility-administered medications for this visit.    OBJECTIVE: Middle-aged white woman who appears stated age 45 Vitals:   03/31/15 1244  BP: 136/62  Pulse: 66  Temp: 98.2 F (36.8 C)  Resp: 18     Body mass index is 21.38 kg/(m^2).    ECOG FS:0 - Asymptomatic  Ocular: Sclerae unicteric, pupils equal, round and reactive to light Ear-nose-throat: Oropharynx clear and  moist Lymphatic: No cervical or supraclavicular adenopathy Lungs no rales or rhonchi, good excursion bilaterally Heart regular rate and rhythm, no murmur appreciated Abd soft, nontender, positive bowel sounds MSK no focal spinal tenderness, no joint edema Neuro: non-focal, well-oriented, appropriate affect Breasts: The right breast is status post recent biopsy. I do not palpate a mass. There are no skin or nipple changes of concern. There is no ecchymosis. The right axilla is benign. The left breast is unremarkable   LAB RESULTS:  CMP     Component Value Date/Time   NA 141 03/31/2015 1218   K 3.7 03/31/2015 1218   CO2 26 03/31/2015 1218   GLUCOSE 89 03/31/2015 1218   BUN 9.8 03/31/2015 1218   CREATININE 0.9 03/31/2015 1218   CALCIUM 9.2 03/31/2015 1218   PROT 7.0 03/31/2015 1218   ALBUMIN 3.5 03/31/2015 1218   AST 27 03/31/2015 1218   ALT <9 03/31/2015 1218   ALKPHOS 49 03/31/2015 1218   BILITOT 0.74 03/31/2015 1218    INo results found for: SPEP, UPEP  Lab Results  Component Value Date   WBC 5.6 03/31/2015   NEUTROABS 3.5 03/31/2015   HGB 12.8 03/31/2015   HCT 39.3 03/31/2015   MCV 85.7 03/31/2015   PLT 219 03/31/2015      Chemistry  Component Value Date/Time   NA 141 03/31/2015 1218   K 3.7 03/31/2015 1218   CO2 26 03/31/2015 1218   BUN 9.8 03/31/2015 1218   CREATININE 0.9 03/31/2015 1218      Component Value Date/Time   CALCIUM 9.2 03/31/2015 1218   ALKPHOS 49 03/31/2015 1218   AST 27 03/31/2015 1218   ALT <9 03/31/2015 1218   BILITOT 0.74 03/31/2015 1218       No results found for: LABCA2  No components found for: LABCA125  No results for input(s): INR in the last 168 hours.  Urinalysis No results found for: COLORURINE, APPEARANCEUR, LABSPEC, PHURINE, GLUCOSEU, HGBUR, BILIRUBINUR, KETONESUR, PROTEINUR, UROBILINOGEN, NITRITE, LEUKOCYTESUR    ELIGIBLE FOR AVAILABLE RESEARCH PROTOCOL: No  STUDIES: Mm Digital Diagnostic Unilat  R  03/25/2015  CLINICAL DATA:  45 year old female status post ultrasound-guided right breast biopsy EXAM: DIAGNOSTIC RIGHT MAMMOGRAM POST ULTRASOUND BIOPSY COMPARISON:  Previous exam(s). FINDINGS: Mammographic images were obtained following ultrasound guided biopsy of a suspicious right breast mass at 12:30, 9 cm from the nipple. Post biopsy mammogram demonstrates the coil shaped biopsy marker to be in the expected location within the superior right breast on the lateral view. The biopsied mass is not visualized on the CC view due to its superior and posterior location. IMPRESSION: Satisfactory marker placement post ultrasound-guided right breast biopsy. Final Assessment: Post Procedure Mammograms for Marker Placement Electronically Signed   By: Pamelia Hoit M.D.   On: 03/25/2015 15:53   US Breast Ltd Uni Right Inc Axilla  03/15/2015  CLINICAL DATA:  Patient returns today to evaluate a possible right breast mass identified on recent screening mammogram. EXAM: DIGITAL DIAGNOSTIC RIGHT MAMMOGRAM WITH 3D TOMOSYNTHESIS WITH CAD ULTRASOUND RIGHT BREAST COMPARISON:  Previous exams including recent screening mammogram dated 03/02/2015. ACR Breast Density Category c: The breast tissue is heterogeneously dense, which may obscure small masses. FINDINGS: On today's additional views of the right breast with 3D tomosynthesis, there is an irregular mass confirmed within the upper right breast, at posterior depth, measuring 6 mm, with associated microcalcifications, only seen on the MLO projection, 12-1 o'clock axis region based on tomosynthesis slice position. Mammographic images were processed with CAD. Targeted ultrasound is performed, showing an irregular hypoechoic mass within the right breast at the 12:30 o'clock axis, 9 cm from the nipple, at posterior depth, measuring 0.6 x 0.4 x 0.5 cm. Right axilla was also evaluated with ultrasound showing no enlarged or morphologically abnormal lymph nodes. IMPRESSION: Irregular mass  within the right breast at the 12:30 o'clock axis, 9 cm from the nipple, at posterior depth, measuring 0.6 x 0.4 x 0.5 cm, corresponding to the mammographic finding. Configuration on ultrasound is somewhat suggestive of lymph node, however, the irregular margins on mammogram and the associated microcalcifications are concerning for a primary breast cancer. This is a suspicious finding for which ultrasound-guided biopsy is recommended. RECOMMENDATION: Ultrasound-guided biopsy of the right breast mass. Postprocedure mammogram to ensure sonographic and mammographic correlation. Ultrasound-guided biopsy is scheduled for 03/29/2015 at 2 p.m. I have discussed the findings and recommendations with the patient. Results were also provided in writing at the conclusion of the visit. If applicable, a reminder letter will be sent to the patient regarding the next appointment. BI-RADS CATEGORY  4: Suspicious. Electronically Signed   By: Franki Cabot M.D.   On: 03/15/2015 17:18   Mm Diag Breast Tomo Uni Right  03/15/2015  CLINICAL DATA:  Patient returns today to evaluate a possible right breast mass identified on  recent screening mammogram. EXAM: DIGITAL DIAGNOSTIC RIGHT MAMMOGRAM WITH 3D TOMOSYNTHESIS WITH CAD ULTRASOUND RIGHT BREAST COMPARISON:  Previous exams including recent screening mammogram dated 03/02/2015. ACR Breast Density Category c: The breast tissue is heterogeneously dense, which may obscure small masses. FINDINGS: On today's additional views of the right breast with 3D tomosynthesis, there is an irregular mass confirmed within the upper right breast, at posterior depth, measuring 6 mm, with associated microcalcifications, only seen on the MLO projection, 12-1 o'clock axis region based on tomosynthesis slice position. Mammographic images were processed with CAD. Targeted ultrasound is performed, showing an irregular hypoechoic mass within the right breast at the 12:30 o'clock axis, 9 cm from the nipple, at  posterior depth, measuring 0.6 x 0.4 x 0.5 cm. Right axilla was also evaluated with ultrasound showing no enlarged or morphologically abnormal lymph nodes. IMPRESSION: Irregular mass within the right breast at the 12:30 o'clock axis, 9 cm from the nipple, at posterior depth, measuring 0.6 x 0.4 x 0.5 cm, corresponding to the mammographic finding. Configuration on ultrasound is somewhat suggestive of lymph node, however, the irregular margins on mammogram and the associated microcalcifications are concerning for a primary breast cancer. This is a suspicious finding for which ultrasound-guided biopsy is recommended. RECOMMENDATION: Ultrasound-guided biopsy of the right breast mass. Postprocedure mammogram to ensure sonographic and mammographic correlation. Ultrasound-guided biopsy is scheduled for 03/29/2015 at 2 p.m. I have discussed the findings and recommendations with the patient. Results were also provided in writing at the conclusion of the visit. If applicable, a reminder letter will be sent to the patient regarding the next appointment. BI-RADS CATEGORY  4: Suspicious. Electronically Signed   By: Franki Cabot M.D.   On: 03/15/2015 17:18   [place] Rt Breast Bx W Loc Dev 1st Lesion Img Bx Spec US Guide  03/29/2015  ADDENDUM REPORT: 03/26/2015 11:10 ADDENDUM: Pathology revealed GRADE I INVASIVE DUCTAL CARCINOMA, DUCTAL CARCINOMA IN SITU of the Right breast at the 12:30 o'clock location. This was found to be concordant by Dr. Pamelia Hoit. Pathology results were discussed with the patient by telephone. The patient reported doing well after the biopsy. Post biopsy instructions and care were reviewed and questions were answered. The patient was encouraged to call [facility] for any additional concerns. The patient was referred to [facility] on March 31, 2015. Pathology results reported by Terie Purser, RN on  03/26/2015. Electronically Signed   By: Pamelia Hoit M.D.   On: 03/26/2015 11:10  03/29/2015  CLINICAL DATA:  45 year old female for ultrasound-guided biopsy of a suspicious right breast mass at 12:30 EXAM: ULTRASOUND GUIDED RIGHT BREAST CORE NEEDLE BIOPSY COMPARISON:  Previous exam(s). FINDINGS: I met with the patient and we discussed the procedure of ultrasound-guided biopsy, including benefits and alternatives. We discussed the high likelihood of a successful procedure. We discussed the risks of the procedure, including infection, bleeding, tissue injury, clip migration, and inadequate sampling. Informed written consent was given. The usual time-out protocol was performed immediately prior to the procedure. Using sterile technique and 2% Lidocaine as local anesthetic, under direct ultrasound visualization, a 12 gauge spring-loaded device was used to perform biopsy of a suspicious right breast mass using a lateral to medial approach. At the conclusion of the procedure a coil shaped tissue marker clip was deployed into the biopsy cavity. Follow up 2 view mammogram was performed and dictated separately. IMPRESSION: Ultrasound guided biopsy of a suspicious right breast  mass at 12:30. No apparent complications. Electronically Signed: By: Pamelia Hoit M.D. On: 03/25/2015 15:47    ASSESSMENT: 45 y.o. [place] woman status post right breast biopsy 03/25/2015 for a clinical T1BN0 invasive ductal carcinoma, grade 1, estrogen and progesterone receptor positive, HER-2 nonamplified, with an MIB-1 of 3%  (1) definitive surgery pending  (2) Oncotype DX to be obtained from the definitive surgical sample.  (3) adjuvant radiation to follow  (4) adjuvant anti-estrogens to follow local treatment.  PLAN: We spent the better part of today's hour-long appointment discussing the biology of breast cancer in general, and the specifics of the patient's tumor in particular. Story understands that she has an early stage,  nonaggressive-looking, slow-growing breast cancer. She also understands the difference between local and systemic therapy. He  With regards to local treatment it is important to emphasize that there is no difference in terms of survival between lumpectomy plus radiation as compared to mastectomy. This is particularly important in Charliee's case because she qualifies for genetic testing. Even if she does carry a deleterious mutation that significantly increases her risk of developing another breast cancer in the future, it would still be safe for her to keep her breasts if she wishes. We would in that case intensify screening with yearly MRI in addition to yearly mammography.  Of course bilateral mastectomies also would be an option in that situation. At this point she is strongly leaning against that option, but because she is not quite sure I think it would be a good idea to get the genetics testing done as soon as possible to avoid surgical delays. (We would not want her to have a lumpectomy and then choose to go for bilateral mastectomies and so having to undergo 2 separate procedures).  Aside from local treatment issues in terms of systemic therapy she will be a good candidate for anti-estrogens. Because she remains premenopausal, tamoxifen will be the likely choice. She is not a candidate for anti-HER-2 immunotherapy area we discussed the fact that chemotherapy works well and rapidly growing aggressive tumors, the opposite of what she has.  Accordingly my expectation is that she would not benefit from chemotherapy. To establish that however assuming her tumor does prove to be larger than 5 mm, we will request an Oncotype. If, as I expect, this will show the tumor to be "low risk", we will simply call her with that information and she will proceed directly to radiation. She will then see me in approximately 2 months to discuss anti-estrogens.  I requested that she stop her birth control pills. I have  suggested that she discuss alternative forms of contraception with Dr. Ouida Sills her gynecologist. In particular, with tamoxifen a Mirena IUD is a generally good choice because it tends to decrease the endometrial lining thickening and polyps, and possibly even the development of endometrial carcinoma, in patients taking tamoxifen. This is particularly relevant in this patient whose mother had an unusual endometrial tumor according to the patient.  [place] has a good understanding of the overall plan. She agrees with it. She knows the goal of treatment in her case is cure. She will call with any problems that may develop before her next visit here.  Chauncey Cruel, MD   03/31/2015 3:22 PM Medical Oncology and Hematology [facility] [address] [place],  [address] Tel. [phone]    Fax. [phone]

## 2015-04-01 ENCOUNTER — Telehealth: Payer: Self-pay | Admitting: *Deleted

## 2015-04-01 NOTE — Telephone Encounter (Signed)
Spoke to pt concerning [place] from 03/31/15. Denies questions or concerns regarding dx or treatment care plan. Scheduled and confirmed pt to for genetics counseling on 04/02/15 at 0900.  Encourage pt to call with needs. Received verbal understanding.

## 2015-04-02 ENCOUNTER — Other Ambulatory Visit: Payer: BLUE CROSS/BLUE SHIELD

## 2015-04-02 ENCOUNTER — Ambulatory Visit (HOSPITAL_BASED_OUTPATIENT_CLINIC_OR_DEPARTMENT_OTHER): Payer: BLUE CROSS/BLUE SHIELD | Admitting: Genetic Counselor

## 2015-04-02 ENCOUNTER — Encounter: Payer: Self-pay | Admitting: Genetic Counselor

## 2015-04-02 DIAGNOSIS — Z800 Family history of malignant neoplasm of digestive organs: Secondary | ICD-10-CM

## 2015-04-02 DIAGNOSIS — C50211 Malignant neoplasm of upper-inner quadrant of right female breast: Secondary | ICD-10-CM | POA: Diagnosis not present

## 2015-04-02 DIAGNOSIS — Z808 Family history of malignant neoplasm of other organs or systems: Secondary | ICD-10-CM | POA: Diagnosis not present

## 2015-04-02 DIAGNOSIS — Z803 Family history of malignant neoplasm of breast: Secondary | ICD-10-CM

## 2015-04-02 DIAGNOSIS — Z315 Encounter for genetic counseling: Secondary | ICD-10-CM

## 2015-04-02 DIAGNOSIS — Z8049 Family history of malignant neoplasm of other genital organs: Secondary | ICD-10-CM

## 2015-04-02 NOTE — Progress Notes (Signed)
REFERRING PROVIDER: Ronita Hipps, MD [address] [place] [address],   Lurline Del, MD  PRIMARY PROVIDER:  Ronita Hipps, MD  PRIMARY REASON FOR VISIT:  1. Breast cancer of upper-inner quadrant of right female breast ([place])   2. Family history of breast cancer   3. Family history of uterine cancer   4. Family history of melanoma   5. Family history of colon cancer      HISTORY OF PRESENT ILLNESS:   Renee Wright, a 45 y.o. female, was seen for a [facility] cancer genetics consultation at the request of Dr. Jana Hakim due to a personal and family history of cancer.  Renee Wright presents to clinic today to discuss the possibility of a hereditary predisposition to cancer, genetic testing, and to further clarify her future cancer risks, as well as potential cancer risks for family members.   In 2017, at the age of 51, Renee Wright was diagnosed with invasive ductal carcinoma of the right breast. The tumor is ER+/PR+/Her2-. This will be treated with lumpectomy, based on the genetics resutls, and radiatioin.    CANCER HISTORY:   No history exists.     HORMONAL RISK FACTORS:  Menarche was at age 64.  First live birth at age 18.  OCP use for approximately 31 years.  Ovaries intact: yes.  Hysterectomy: no.  Menopausal status: premenopausal.  HRT use: 0 years. Colonoscopy: no; not examined. Mammogram within the last year: yes. Number of breast biopsies: 3. Up to date with pelvic exams:  yes. Any excessive radiation exposure in the past:  no  Past Medical History  Diagnosis Date  . Breast calcification, right 01/2011  . Breast cyst 01/13/2011    Right breast at 7:30 areolar edge   . IBS (irritable bowel syndrome)   . Breast cancer of upper-inner quadrant of right female breast ([place]) 03/26/2015  . Breast cancer ([place])   . Family history of breast cancer   . Family history of uterine cancer   . Family history of melanoma   . Family history of colon cancer     Past  Surgical History  Procedure Laterality Date  . Cesarean section  05/29/96, 03/30/00  . Breast biopsy  10/1991  . Breast biopsy  02/02/2011    Procedure: BREAST BIOPSY WITH NEEDLE LOCALIZATION;  Surgeon: Haywood Lasso, MD;  Location: [facility];  Service: General;  Laterality: Right;  removal of right breast calcifications  . Breast lumpectomy  01/17/11    benign- right breast ([facility])    Social History   Social History  . Marital Status: Married    Spouse Name: Ulice Dash  . Number of Children: 2  . Years of Education: N/A   Occupational History  . [employer] History Main Topics  . Smoking status: Never Smoker   . Smokeless tobacco: Never Used  . Alcohol Use: Yes  . Drug Use: No  . Sexual Activity: Not Asked   Other Topics Concern  . None   Social History Narrative   Married, 2 children.      Drinks one cup of caffeine daily.     FAMILY HISTORY:  We obtained a detailed, 4-generation family history.  Significant diagnoses are listed below: Family History  Problem Relation Age of Onset  . Breast cancer Maternal Aunt 62  . Uterine cancer Mother 24  . Melanoma Maternal Grandmother 75    mets to bladder  . Colon cancer Paternal Grandmother  dx over 92    The patient has two daughters who are healthy.  She has one brother who has multiple healthy problems but is cancer free.  Her mother was diagnosed with a rare form of uterine cancer at age 73, and her father is alive and cancer free.  Her mother has two brothers and two sisters.  One sister was diagnosed with breast cancer at age 110, and the other sister committed suicide at 44.  One brother has cancer but Renee Wright cannot remember what type.  The patient's maternal grandparents are both deceased.  Her grandmother had melanoma that spread to her bladder.  The patient's father had one brotehr and two sisters none who had cancer.  His mother had colon cancer. Patient's maternal ancestors are  of Caucasian descent, and paternal ancestors are of [place] descent. There is no reported Ashkenazi Jewish ancestry. There is no known consanguinity.  GENETIC COUNSELING ASSESSMENT: Renee Wright is a 46 y.o. female with a personal and family history of cancer which is somewhat suggestive of a hereditary cancer syndrome and predisposition to cancer. We, therefore, discussed and recommended the following at today's visit.   DISCUSSION: We discussed that about 5-10% of breast cancer is hereditary, with most cases due to BRCA mutations.  We discussed that other genes can also be implicated for hereditary breast cancer syndromes including ATM, PALB2 and CHEK2.  Her mother had an early onset of uterine cancer.  We discussed that there is some crossover with the genes we are testing for breast cancer syndromes, with uterine cancer syndromes.  However, there are a couple genes that are not on the breast cancer panel, MUTYH and POLD1, that increase the risk for uterine cancer.  We can add those one.  We reviewed the characteristics, features and inheritance patterns of hereditary cancer syndromes. We also discussed genetic testing, including the appropriate family members to test, the process of testing, insurance coverage and turn-around-time for results. We discussed the implications of a negative, positive and/or variant of uncertain significant result. We recommended Renee Wright pursue genetic testing for the custom gene panel gene panel. The Custom gene panel offered by [facility] includes sequencing and rearrangement analysis for the following 22 genes:  ATM, BARD1, BRCA1, BRCA2, BRIP1, CDH1, CHEK2, EPCAM, FANCC, MLH1, MSH2, MSH6, MUTYH, NBN, PALB2, PMS2, POLD1, PTEN, RAD51C, RAD51D, TP53, and XRCC2.     Based on Renee Wright's personal and family history of cancer, she meets medical criteria for genetic testing. Despite that she meets criteria, she may still have an out of pocket cost. We discussed that if her out  of pocket cost for testing is over $100, the laboratory will call and confirm whether she wants to proceed with testing.  If the out of pocket cost of testing is less than $100 she will be billed by the genetic testing laboratory.   PLAN: After considering the risks, benefits, and limitations, Renee Wright  provided informed consent to pursue genetic testing and the blood sample was sent to [facility] for analysis of the Custom Gene panel. We have asked that the results be RUSHed for surgery decisions.  This should increase the chance that the results should be available within approximately 2 weeks' time, at which point they will be disclosed by telephone to Renee Wright, as will any additional recommendations warranted by these results. Renee Wright will receive a summary of her genetic counseling visit and a copy of her results once available. This information will also be  available in [place]. We encouraged Renee Wright to remain in contact with cancer genetics annually so that we can continuously update the family history and inform her of any changes in cancer genetics and testing that may be of benefit for her family. Renee Wright questions were answered to her satisfaction today. Our contact information was provided should additional questions or concerns arise.  Lastly, we encouraged Renee Wright to remain in contact with cancer genetics annually so that we can continuously update the family history and inform her of any changes in cancer genetics and testing that may be of benefit for this family.   Ms.  Wright questions were answered to her satisfaction today. Our contact information was provided should additional questions or concerns arise. Thank you for the referral and allowing [place] to share in the care of your patient.   Shirleyann Montero P. Florene Glen, [place], [facility] Certified Genetic Counselor [email or web address] phone: [phone]  The patient was seen for a total of 60 minutes in face-to-face genetic  counseling.  This patient was discussed with Drs. Magrinat, Lindi Adie and/or Burr Medico who agrees with the above.    _______________________________________________________________________ For Office Staff:  Number of people involved in session: 2 Was an Intern/ student involved with case: no

## 2015-04-05 ENCOUNTER — Encounter: Payer: Self-pay | Admitting: *Deleted

## 2015-04-09 NOTE — Progress Notes (Signed)
   [facility] Psychosocial Distress Screening Counseling Intern   Counseling Intern was referred by distress screening protocol.  The patient scored a 7 on the Psychosocial Distress Thermometer which indicates Moderate distress. Counseling Intern   to assess for distress and other psychosocial needs.   ON[facility]N DISTRESS SCREENING 04/07/2015  Screening Type Initial Screening  Distress experienced in past week (1-10) 7  Emotional problem type Nervousness/Anxiety;Adjusting to illness  Information Concerns Type Lack of info about diagnosis;Lack of info about treatment;Lack of info about complementary therapy choices  Referral to support programs Yes   Counseling Intern Note: Met with pt and husband Jay during [facility]. Pt reported that her distress had decreased a little due to her informational concerns being met during clinic. Pt stated that she is anxious about her Dx and Tx but stated that she has a strong support system in her husband, family and friends.  Counselor provided information about services available in the support center and the alight mentorship program in particular.   RU: No specific support center follow up indicated at this time, pt stated that she would reach out as needed.  

## 2015-04-14 ENCOUNTER — Telehealth: Payer: Self-pay | Admitting: Genetic Counselor

## 2015-04-14 ENCOUNTER — Encounter: Payer: Self-pay | Admitting: Genetic Counselor

## 2015-04-14 ENCOUNTER — Ambulatory Visit: Payer: Self-pay | Admitting: Genetic Counselor

## 2015-04-14 DIAGNOSIS — Z8049 Family history of malignant neoplasm of other genital organs: Secondary | ICD-10-CM

## 2015-04-14 DIAGNOSIS — Z1379 Encounter for other screening for genetic and chromosomal anomalies: Secondary | ICD-10-CM | POA: Insufficient documentation

## 2015-04-14 DIAGNOSIS — Z803 Family history of malignant neoplasm of breast: Secondary | ICD-10-CM

## 2015-04-14 DIAGNOSIS — C50211 Malignant neoplasm of upper-inner quadrant of right female breast: Secondary | ICD-10-CM

## 2015-04-14 DIAGNOSIS — Z808 Family history of malignant neoplasm of other organs or systems: Secondary | ICD-10-CM

## 2015-04-14 DIAGNOSIS — Z800 Family history of malignant neoplasm of digestive organs: Secondary | ICD-10-CM

## 2015-04-14 NOTE — Telephone Encounter (Signed)
LM on VM with good news.  Asked that she CB. 

## 2015-04-14 NOTE — Progress Notes (Addendum)
HPI: Ms. Milholland was previously seen in the [facility] due to a personal and family history of cancer and concerns regarding a hereditary predisposition to cancer. Please refer to our prior cancer genetics clinic note for more information regarding Ms. Auer's medical, social and family histories, and our assessment and recommendations, at the time. Ms. Armel recent genetic test results were disclosed to her, as were recommendations warranted by these results. These results and recommendations are discussed in more detail below.  FAMILY HISTORY:  We obtained a detailed, 4-generation family history.  Significant diagnoses are listed below: Family History  Problem Relation Age of Onset  . Breast cancer Maternal Aunt 62  . Uterine cancer Mother 81  . Melanoma Maternal Grandmother 75    mets to bladder  . Colon cancer Paternal Grandmother     dx over 66    The patient has two daughters who are healthy. She has one brother who has multiple healthy problems but is cancer free. Her mother was diagnosed with a rare form of uterine cancer at age 48, and her father is alive and cancer free. Her mother has two brothers and two sisters. One sister was diagnosed with breast cancer at age 95, and the other sister committed suicide at 2. One brother has cancer but Ms. Beechy cannot remember what type. The patient's maternal grandparents are both deceased. Her grandmother had melanoma that spread to her bladder. The patient's father had one brotehr and two sisters none who had cancer. His mother had colon cancer. Patient's maternal ancestors are of Caucasian descent, and paternal ancestors are of [place] descent. There is no reported Ashkenazi Jewish ancestry. There is no known consanguinity.  GENETIC TEST RESULTS: At the time of Ms. Dobis's visit, we recommended she pursue genetic testing of the custom gene panel that looked at Breast/Ovarian/Endometrial cancer genes. The  Custom gene panel offered by [facility] includes sequencing and rearrangement analysis for the following 22 genes:  ATM, BARD1, BRCA1, BRCA2, BRIP1, CDH1, CHEK2, EPCAM, FANCC, MLH1, MSH2, MSH6, MUTYH, NBN, PALB2, PMS2, POLD1, PTEN, RAD51C, RAD51D, TP53, and XRCC2.     The report date is April 13, 2015.  Genetic testing was normal, and did not reveal a deleterious mutation in these genes. The test report has been scanned into EPIC and is located under the Molecular Pathology section of the Results Review tab. UPDATE: ATM c.[id number] VUS has been reclassified from a variant of uncertain significance to a likely benign variant based on a combination of sources, e.g., internal data, published literature, population databases and in silico models.  The reprot date is August 15, 2016.  We discussed with Ms. Knodel that since the current genetic testing is not perfect, it is possible there may be a gene mutation in one of these genes that current testing cannot detect, but that chance is small. We also discussed, that it is possible that another gene that has not yet been discovered, or that we have not yet tested, is responsible for the cancer diagnoses in the family, and it is, therefore, important to remain in touch with cancer genetics in the future so that we can continue to offer Ms. Lankford the most up to date genetic testing.   Genetic testing did detect a Variant of Unknown Significance in the ATM gene called c.6067G>A. At this time, it is unknown if this variant is associated with increased cancer risk or if this is a normal finding, but most variants such as this get reclassified  to being inconsequential. It should not be used to make medical management decisions. With time, we suspect the lab will determine the significance of this variant, if any. At this time, other laboratories do classify this VUS as likely benign.  If we do learn more about it, we will try to contact Ms. Rister to discuss it further. However,  it is important to stay in touch with [place] periodically and keep the address and phone number up to date.  CANCER SCREENING RECOMMENDATIONS: This result is reassuring and indicates that Ms. Wachsmuth likely does not have an increased risk for a future cancer due to a mutation in one of these genes. This normal test also suggests that Ms. Styer's cancer was most likely not due to an inherited predisposition associated with one of these genes.  Most cancers happen by chance and this negative test suggests that her cancer falls into this category.  We, therefore, recommended she continue to follow the cancer management and screening guidelines provided by her oncology and primary healthcare provider.   RECOMMENDATIONS FOR FAMILY MEMBERS: Women in this family might be at some increased risk of developing cancer, over the general population risk, simply due to the family history of cancer. We recommended women in this family have a yearly mammogram beginning at age 93, or 67 years younger than the earliest onset of cancer, an an annual clinical breast exam, and perform monthly breast self-exams. Women in this family should also have a gynecological exam as recommended by their primary provider. All family members should have a colonoscopy by age 4.  FOLLOW-UP: Lastly, we discussed with Ms. Boehm that cancer genetics is a rapidly advancing field and it is possible that new genetic tests will be appropriate for her and/or her family members in the future. We encouraged her to remain in contact with cancer genetics on an annual basis so we can update her personal and family histories and let her know of advances in cancer genetics that may benefit this family.   Our contact number was provided. Ms. Rueger questions were answered to her satisfaction, and she knows she is welcome to call us at anytime with additional questions or concerns.   Roma Kayser, MS, [facility] Certified Genetic  Counselor [email or web address]

## 2015-04-14 NOTE — Telephone Encounter (Signed)
Revealed negative genetic testing.  Discussed that ATM VUS was found.  Other labs call this VUS benign, so this could be reclassified in the future relatively quickly.  Will let her care team know.

## 2015-04-30 ENCOUNTER — Ambulatory Visit: Payer: Self-pay | Admitting: Surgery

## 2015-04-30 DIAGNOSIS — C50111 Malignant neoplasm of central portion of right female breast: Secondary | ICD-10-CM

## 2015-04-30 NOTE — H&P (Signed)
Renee Wright. [place] 04/30/2015 10:16 AM Location: [facility] Patient #: [id number] DOB: Jun 16, 1970 Married / Language: English / Race: White Female  History of Present Illness Renee Wright A. Renee Safley MD; 04/30/2015 12:47 PM) Patient words: Pt sent at the request of Dr Isidore Moos for right breast cancer detected on screening mammogram and core biopsies a 0.6 x 0.4 x 0.5 IDC/DCIS right upper outer quadrant. ER/PR + HER 2 NEU -. Pt denies breast mass, pain or discharge bilaterally.    Patient has had genetic counseling and screening this is negative. She is seeing Dr. [name] of plastic surgery and both lumpectomy and mastectomy options are discussed with reconstruction and potential additional therapies that may be necessary secondary to her small breast size as well as possible cosmetic outcomes.   She has no complaints.  The patient is a 45 year old female.   Allergies Renee Wright, CMA; 04/30/2015 10:16 AM) No Known Drug Allergies 04/30/2015  Medication History (Renee Wright, CMA; 04/30/2015 10:17 AM) Renee Wright (3-0.02MG  Tablet, Oral) Active. Fluticasone Propionate (50MCG/ACT Suspension, Nasal) Active. Vitamin D (Ergocalciferol) (50000UNIT Capsule, Oral) Active. [name] (3-0.02MG  Tablet, Oral) Active. Medications Reconciled    Vitals (Renee Wright CMA; 04/30/2015 10:16 AM) 04/30/2015 10:16 AM Weight: 125 lb Height: 66in Body Surface Area: 1.64 m Body Mass Index: 20.18 kg/m  Temp.: 17F(Temporal)  Pulse: 76 (Regular)  BP: 124/80 (Sitting, Left Arm, Standard)      Physical Exam (Kersten Salmons A. Daisie Haft MD; 04/30/2015 12:47 PM)  General Mental Status-Alert. General Appearance-Consistent with stated age. Hydration-Well hydrated. Voice-Normal.  Head and Neck Head-normocephalic, atraumatic with no lesions or palpable masses.  Breast Note: Small A cup breasts bilaterally. No masses bilaterally.    Assessment & Plan (Sierria Bruney A. Emmet Messer MD; 04/30/2015  12:48 PM)  BREAST CANCER, RIGHT (C50.911) Impression: discussed breast conservation with lumpectomy and SLN mapping vs mastectomy and reconstruction, NSM and the pro and cons. Small busted so lumpectomy may cause some mild cosmetic deformity. Risk of lumpectomy include bleeding, infection, seroma, more surgery, use of seed/wire, wound care, cosmetic deformity and the need for other treatments, death , blood clots, death. Pt agrees to proceed. Risk of sentinel lymph node mapping include bleeding, infection, lymphedema, shoulder pain. stiffness, dye allergy. cosmetic deformity , blood clots, death, need for more surgery. Pt agres to proceed.    I will long discussion with the patient and her husband and re-reviewed [name]   She is still a candidate for lumpectomy but she could have cosmetic issues postoperatively. I think with all plastic techniques we can avoid this. I discussed plastic reconstruction options as well as the need for radiation therapy, possible chemotherapy, and lymph node mapping. She still would like to proceed with breast conservation. Risk of lumpectomy include bleeding, infection, seroma, more surgery, use of seed/wire, wound care, cosmetic deformity and the need for other treatments, death , blood clots, death. Pt agrees to proceed. Risk of sentinel lymph node mapping include bleeding, infection, lymphedema, shoulder pain. stiffness, dye allergy. cosmetic deformity , blood clots, death, need for more surgery. Pt agres to proceed.  Current Plans You are being scheduled for surgery - Our schedulers will call you.  You should hear from our office's scheduling department within 5 working days about the location, date, and time of surgery. We try to make accommodations for patient's preferences in scheduling surgery, but sometimes the OR schedule or the surgeon's schedule prevents [place] from making those accommodations.  If you have not heard from our office [phone]) in 5  working days, call the office and ask for your surgeon's nurse.  If you have other questions about your diagnosis, plan, or surgery, call the office and ask for your surgeon's nurse.  We discussed the staging and pathophysiology of breast cancer. We discussed all of the different options for treatment for breast cancer including surgery, chemotherapy, radiation therapy, Herceptin, and antiestrogen therapy. We discussed a sentinel lymph node biopsy as she does not appear to having lymph node involvement right now. We discussed the performance of that with injection of radioactive tracer and blue dye. We discussed that she would have an incision underneath her axillary hairline. We discussed that there is a bout a 10-20% chance of having a positive node with a sentinel lymph node biopsy and we will await the permanent pathology to make any other first further decisions in terms of her treatment. One of these options might be to return to the operating room to perform an axillary lymph node dissection. We discussed about a 1-2% risk lifetime of chronic shoulder pain as well as lymphedema associated with a sentinel lymph node biopsy. We discussed the options for treatment of the breast cancer which included lumpectomy versus a mastectomy. We discussed the performance of the lumpectomy with a wire placement. We discussed a 10-20% chance of a positive margin requiring reexcision in the operating room. We also discussed that she may need radiation therapy or antiestrogen therapy or both if she undergoes lumpectomy. We discussed the mastectomy and the postoperative care for that as well. We discussed that there is no difference in her survival whether she undergoes lumpectomy with radiation therapy or antiestrogen therapy versus a mastectomy. There is a slight difference in the local recurrence rate being 3-5% with lumpectomy and about 1% with a mastectomy. We discussed the risks of operation including bleeding,  infection, possible reoperation. She understands her further therapy will be based on what her stages at the time of her operation.  Pt Education - flb breast cancer surgery: discussed with patient and provided information. Pt Education - [facility] Breast Biopsy HCI: discussed with patient and provided information. Pt Education - ABC (After Breast Cancer) Class Info: discussed with patient and provided information.

## 2015-05-05 ENCOUNTER — Encounter: Payer: Self-pay | Admitting: *Deleted

## 2015-05-06 ENCOUNTER — Other Ambulatory Visit: Payer: Self-pay | Admitting: Surgery

## 2015-05-06 DIAGNOSIS — C50111 Malignant neoplasm of central portion of right female breast: Secondary | ICD-10-CM

## 2015-05-20 ENCOUNTER — Encounter (HOSPITAL_BASED_OUTPATIENT_CLINIC_OR_DEPARTMENT_OTHER): Payer: Self-pay | Admitting: *Deleted

## 2015-05-21 ENCOUNTER — Encounter (HOSPITAL_BASED_OUTPATIENT_CLINIC_OR_DEPARTMENT_OTHER)
Admission: RE | Admit: 2015-05-21 | Discharge: 2015-05-21 | Disposition: A | Discharge status: Home / Self Care | Payer: BLUE CROSS/BLUE SHIELD | Source: Ambulatory Visit | Attending: Surgery | Admitting: Surgery

## 2015-05-21 DIAGNOSIS — C50411 Malignant neoplasm of upper-outer quadrant of right female breast: Secondary | ICD-10-CM | POA: Diagnosis present

## 2015-05-21 DIAGNOSIS — Z79899 Other long term (current) drug therapy: Secondary | ICD-10-CM | POA: Diagnosis not present

## 2015-05-21 LAB — COMPREHENSIVE METABOLIC PANEL
ALBUMIN: 3.7 g/dL (ref 3.5–5.0)
ALK PHOS: 54 U/L (ref 38–126)
ALT: 26 U/L (ref 14–54)
AST: 47 U/L — AB (ref 15–41)
Anion gap: 9 (ref 5–15)
BILIRUBIN TOTAL: 1 mg/dL (ref 0.3–1.2)
BUN: 9 mg/dL (ref 6–20)
CALCIUM: 9.4 mg/dL (ref 8.9–10.3)
CO2: 28 mmol/L (ref 22–32)
CREATININE: 0.79 mg/dL (ref 0.44–1.00)
Chloride: 106 mmol/L (ref 101–111)
GFR calc Af Amer: >60 mL/min (ref >60)
GLUCOSE: 72 mg/dL (ref 65–99)
Potassium: 3.8 mmol/L (ref 3.5–5.1)
Sodium: 143 mmol/L (ref 135–145)
TOTAL PROTEIN: 6.5 g/dL (ref 6.5–8.1)

## 2015-05-21 NOTE — Progress Notes (Signed)
Pt given berry boost breeze and instructed to drink by 1000 on dos. Also instructed this was the only liquid she could have after midnight except sip of water with meds morning of surgery. Written instruction taped to box of boost.  Pt verbalized understanding

## 2015-05-25 ENCOUNTER — Ambulatory Visit
Admission: RE | Admit: 2015-05-25 | Discharge: 2015-05-25 | Disposition: A | Discharge status: Home / Self Care | Payer: BLUE CROSS/BLUE SHIELD | Source: Ambulatory Visit | Attending: Surgery | Admitting: Surgery

## 2015-05-25 ENCOUNTER — Other Ambulatory Visit: Payer: Self-pay | Admitting: Surgery

## 2015-05-25 DIAGNOSIS — C50111 Malignant neoplasm of central portion of right female breast: Secondary | ICD-10-CM

## 2015-05-27 ENCOUNTER — Encounter (HOSPITAL_BASED_OUTPATIENT_CLINIC_OR_DEPARTMENT_OTHER)
Admission: RE | Disposition: A | Discharge status: Home / Self Care | Payer: Self-pay | Source: Ambulatory Visit | Attending: Surgery

## 2015-05-27 ENCOUNTER — Ambulatory Visit
Admission: RE | Admit: 2015-05-27 | Discharge: 2015-05-27 | Disposition: A | Discharge status: Home / Self Care | Payer: BLUE CROSS/BLUE SHIELD | Source: Ambulatory Visit | Attending: Surgery | Admitting: Surgery

## 2015-05-27 ENCOUNTER — Ambulatory Visit (HOSPITAL_BASED_OUTPATIENT_CLINIC_OR_DEPARTMENT_OTHER): Payer: BLUE CROSS/BLUE SHIELD | Admitting: Anesthesiology

## 2015-05-27 ENCOUNTER — Ambulatory Visit (HOSPITAL_COMMUNITY)
Admission: RE | Admit: 2015-05-27 | Discharge: 2015-05-27 | Disposition: A | Discharge status: Home / Self Care | Payer: BLUE CROSS/BLUE SHIELD | Source: Ambulatory Visit | Attending: Surgery | Admitting: Surgery

## 2015-05-27 ENCOUNTER — Ambulatory Visit (HOSPITAL_BASED_OUTPATIENT_CLINIC_OR_DEPARTMENT_OTHER)
Admission: RE | Admit: 2015-05-27 | Discharge: 2015-05-27 | Disposition: A | Discharge status: Home / Self Care | Payer: BLUE CROSS/BLUE SHIELD | Source: Ambulatory Visit | Attending: Surgery | Admitting: Surgery

## 2015-05-27 ENCOUNTER — Encounter (HOSPITAL_BASED_OUTPATIENT_CLINIC_OR_DEPARTMENT_OTHER): Payer: Self-pay | Admitting: Anesthesiology

## 2015-05-27 DIAGNOSIS — C50411 Malignant neoplasm of upper-outer quadrant of right female breast: Secondary | ICD-10-CM | POA: Insufficient documentation

## 2015-05-27 DIAGNOSIS — Z79899 Other long term (current) drug therapy: Secondary | ICD-10-CM | POA: Insufficient documentation

## 2015-05-27 DIAGNOSIS — C50111 Malignant neoplasm of central portion of right female breast: Secondary | ICD-10-CM

## 2015-05-27 HISTORY — DX: Other specified postprocedural states: Z98.890

## 2015-05-27 HISTORY — PX: BREAST LUMPECTOMY: SHX2

## 2015-05-27 HISTORY — DX: Nausea with vomiting, unspecified: R11.2

## 2015-05-27 HISTORY — PX: BREAST LUMPECTOMY WITH RADIOACTIVE SEED AND SENTINEL LYMPH NODE BIOPSY: SHX6550

## 2015-05-27 SURGERY — BREAST LUMPECTOMY WITH RADIOACTIVE SEED AND SENTINEL LYMPH NODE BIOPSY
Anesthesia: General | Site: Breast | Laterality: Right

## 2015-05-27 MED ORDER — FENTANYL CITRATE (PF) 100 MCG/2ML IJ SOLN
INTRAMUSCULAR | Status: AC
  Filled 2015-05-27: qty 2

## 2015-05-27 MED ORDER — OXYCODONE HCL 5 MG PO TABS
ORAL_TABLET | ORAL | Status: AC
  Filled 2015-05-27: qty 1

## 2015-05-27 MED ORDER — BUPIVACAINE-EPINEPHRINE (PF) 0.25% -1:200000 IJ SOLN
INTRAMUSCULAR | Status: DC | PRN
  Administered 2015-05-27: 10 mL

## 2015-05-27 MED ORDER — PROPOFOL 10 MG/ML IV BOLUS
INTRAVENOUS | Status: DC | PRN
  Administered 2015-05-27: 200 mg via INTRAVENOUS

## 2015-05-27 MED ORDER — SCOPOLAMINE 1 MG/3DAYS TD PT72
1.0000 | MEDICATED_PATCH | Freq: Once | TRANSDERMAL | Status: DC | PRN
Start: 2015-05-27 — End: 2015-05-27

## 2015-05-27 MED ORDER — CHLORHEXIDINE GLUCONATE 4 % EX LIQD
1.0000 | Freq: Once | CUTANEOUS | Status: DC
Start: 2015-05-27 — End: 2015-05-27

## 2015-05-27 MED ORDER — GLYCOPYRROLATE 0.2 MG/ML IJ SOLN: 0.2000 mg | Freq: Once | INTRAMUSCULAR | Status: DC | PRN

## 2015-05-27 MED ORDER — SODIUM CHLORIDE 0.9 % IJ SOLN
INTRAVENOUS | Status: DC | PRN
  Administered 2015-05-27: 2 mL via INTRAMUSCULAR

## 2015-05-27 MED ORDER — DEXAMETHASONE SODIUM PHOSPHATE 10 MG/ML IJ SOLN
INTRAMUSCULAR | Status: AC
  Filled 2015-05-27: qty 1

## 2015-05-27 MED ORDER — LIDOCAINE HCL (CARDIAC) 20 MG/ML IV SOLN
INTRAVENOUS | Status: DC | PRN
  Administered 2015-05-27: 80 mg via INTRAVENOUS

## 2015-05-27 MED ORDER — DEXTROSE 5 % IV SOLN
3.0000 g | INTRAVENOUS | Status: AC
  Administered 2015-05-27: 2 g via INTRAVENOUS

## 2015-05-27 MED ORDER — MEPERIDINE HCL 25 MG/ML IJ SOLN: 6.2500 mg | INTRAMUSCULAR | Status: DC | PRN

## 2015-05-27 MED ORDER — OXYCODONE HCL 5 MG/5ML PO SOLN: 5.0000 mg | Freq: Once | ORAL | Status: AC | PRN

## 2015-05-27 MED ORDER — LIDOCAINE 2% (20 MG/ML) 5 ML SYRINGE
INTRAMUSCULAR | Status: AC
  Filled 2015-05-27: qty 5

## 2015-05-27 MED ORDER — PROPOFOL 10 MG/ML IV BOLUS
INTRAVENOUS | Status: AC
  Filled 2015-05-27: qty 20

## 2015-05-27 MED ORDER — LACTATED RINGERS IV SOLN
INTRAVENOUS | Status: DC
  Administered 2015-05-27 (×2): via INTRAVENOUS

## 2015-05-27 MED ORDER — ONDANSETRON HCL 4 MG/2ML IJ SOLN
INTRAMUSCULAR | Status: DC | PRN
  Administered 2015-05-27: 4 mg via INTRAVENOUS

## 2015-05-27 MED ORDER — BUPIVACAINE-EPINEPHRINE (PF) 0.5% -1:200000 IJ SOLN
INTRAMUSCULAR | Status: DC | PRN
  Administered 2015-05-27: 25 mL via PERINEURAL

## 2015-05-27 MED ORDER — HYDROMORPHONE HCL 1 MG/ML IJ SOLN: 0.2500 mg | INTRAMUSCULAR | Status: DC | PRN

## 2015-05-27 MED ORDER — OXYCODONE-ACETAMINOPHEN 5-325 MG PO TABS: 1.0000 | ORAL_TABLET | ORAL | Status: DC | PRN

## 2015-05-27 MED ORDER — MIDAZOLAM HCL 2 MG/2ML IJ SOLN
1.0000 mg | INTRAMUSCULAR | Status: DC | PRN
  Administered 2015-05-27: 2 mg via INTRAVENOUS

## 2015-05-27 MED ORDER — DEXAMETHASONE SODIUM PHOSPHATE 4 MG/ML IJ SOLN
INTRAMUSCULAR | Status: DC | PRN
  Administered 2015-05-27: 10 mg via INTRAVENOUS

## 2015-05-27 MED ORDER — OXYCODONE HCL 5 MG PO TABS
5.0000 mg | ORAL_TABLET | Freq: Once | ORAL | Status: AC | PRN
  Administered 2015-05-27: 5 mg via ORAL

## 2015-05-27 MED ORDER — CEFAZOLIN SODIUM-DEXTROSE 2-4 GM/100ML-% IV SOLN
INTRAVENOUS | Status: AC
  Filled 2015-05-27: qty 100

## 2015-05-27 MED ORDER — ONDANSETRON HCL 4 MG/2ML IJ SOLN
INTRAMUSCULAR | Status: AC
  Filled 2015-05-27: qty 2

## 2015-05-27 MED ORDER — MIDAZOLAM HCL 2 MG/2ML IJ SOLN
INTRAMUSCULAR | Status: AC
  Filled 2015-05-27: qty 2

## 2015-05-27 MED ORDER — TECHNETIUM TC 99M SULFUR COLLOID FILTERED
1.0000 | Freq: Once | INTRAVENOUS | Status: AC | PRN
  Administered 2015-05-27: 1 via INTRADERMAL

## 2015-05-27 MED ORDER — FENTANYL CITRATE (PF) 100 MCG/2ML IJ SOLN
50.0000 ug | INTRAMUSCULAR | Status: AC | PRN
  Administered 2015-05-27: 100 ug via INTRAVENOUS
  Administered 2015-05-27 (×4): 50 ug via INTRAVENOUS

## 2015-05-27 SURGICAL SUPPLY — 48 items
APPLIER CLIP 9.375 MED OPEN (MISCELLANEOUS) ×2
BINDER BREAST LRG (GAUZE/BANDAGES/DRESSINGS) IMPLANT
BINDER BREAST MEDIUM (GAUZE/BANDAGES/DRESSINGS) ×2 IMPLANT
BINDER BREAST XLRG (GAUZE/BANDAGES/DRESSINGS) IMPLANT
BINDER BREAST XXLRG (GAUZE/BANDAGES/DRESSINGS) IMPLANT
BLADE SURG 15 STRL LF DISP TIS (BLADE) ×1 IMPLANT
BLADE SURG 15 STRL SS (BLADE) ×1
CANISTER SUC SOCK COL 7IN (MISCELLANEOUS) IMPLANT
CANISTER SUCT 1200ML W/VALVE (MISCELLANEOUS) ×2 IMPLANT
CHLORAPREP W/TINT 26ML (MISCELLANEOUS) ×2 IMPLANT
CLIP APPLIE 9.375 MED OPEN (MISCELLANEOUS) ×1 IMPLANT
COVER BACK TABLE 60X90IN (DRAPES) ×2 IMPLANT
COVER MAYO STAND STRL (DRAPES) ×2 IMPLANT
COVER PROBE W GEL 5X96 (DRAPES) ×2 IMPLANT
DECANTER SPIKE VIAL GLASS SM (MISCELLANEOUS) IMPLANT
DEVICE DUBIN W/COMP PLATE 8390 (MISCELLANEOUS) ×2 IMPLANT
DRAPE LAPAROSCOPIC ABDOMINAL (DRAPES) ×2 IMPLANT
DRAPE UTILITY XL STRL (DRAPES) ×2 IMPLANT
ELECT COATED BLADE 2.86 ST (ELECTRODE) ×2 IMPLANT
ELECT REM PT RETURN 9FT ADLT (ELECTROSURGICAL) ×2
ELECTRODE REM PT RTRN 9FT ADLT (ELECTROSURGICAL) ×1 IMPLANT
GLOVE BIOGEL PI IND STRL 7.0 (GLOVE) ×1 IMPLANT
GLOVE BIOGEL PI IND STRL 8 (GLOVE) ×1 IMPLANT
GLOVE BIOGEL PI INDICATOR 7.0 (GLOVE) ×1
GLOVE BIOGEL PI INDICATOR 8 (GLOVE) ×1
GLOVE ECLIPSE 6.5 STRL STRAW (GLOVE) ×2 IMPLANT
GLOVE ECLIPSE 8.0 STRL XLNG CF (GLOVE) ×4 IMPLANT
GLOVE EXAM NITRILE EXT CUFF MD (GLOVE) ×2 IMPLANT
GOWN STRL REUS W/ TWL LRG LVL3 (GOWN DISPOSABLE) ×2 IMPLANT
GOWN STRL REUS W/TWL LRG LVL3 (GOWN DISPOSABLE) ×2
HEMOSTAT SNOW SURGICEL 2X4 (HEMOSTASIS) IMPLANT
KIT MARKER MARGIN INK (KITS) ×2 IMPLANT
LIQUID BAND (GAUZE/BANDAGES/DRESSINGS) ×2 IMPLANT
NDL SAFETY ECLIPSE 18X1.5 (NEEDLE) IMPLANT
NEEDLE HYPO 18GX1.5 SHARP (NEEDLE)
NEEDLE HYPO 25X1 1.5 SAFETY (NEEDLE) ×4 IMPLANT
NS IRRIG 1000ML POUR BTL (IV SOLUTION) ×2 IMPLANT
PACK BASIN DAY SURGERY FS (CUSTOM PROCEDURE TRAY) ×2 IMPLANT
PENCIL BUTTON HOLSTER BLD 10FT (ELECTRODE) ×2 IMPLANT
SLEEVE SCD COMPRESS KNEE MED (MISCELLANEOUS) ×2 IMPLANT
SPONGE LAP 4X18 X RAY DECT (DISPOSABLE) ×2 IMPLANT
SUT MNCRL AB 4-0 PS2 18 (SUTURE) ×4 IMPLANT
SUT VICRYL 3-0 CR8 SH (SUTURE) ×2 IMPLANT
SYR CONTROL 10ML LL (SYRINGE) ×4 IMPLANT
TOWEL OR 17X24 6PK STRL BLUE (TOWEL DISPOSABLE) ×2 IMPLANT
TOWEL OR NON WOVEN STRL DISP B (DISPOSABLE) IMPLANT
TUBE CONNECTING 20X1/4 (TUBING) ×2 IMPLANT
YANKAUER SUCT BULB TIP NO VENT (SUCTIONS) ×2 IMPLANT

## 2015-05-27 NOTE — Progress Notes (Signed)
Assisted Dr. Crews with right, ultrasound guided, pectoralis block. Side rails up, monitors on throughout procedure. See vital signs in flow sheet. Tolerated Procedure well. 

## 2015-05-27 NOTE — Transfer of Care (Signed)
Immediate Anesthesia Transfer of Care Note  Patient: Renee Wright  Procedure(s) Performed: Procedure(s): BREAST LUMPECTOMY WITH RADIOACTIVE SEED AND SENTINEL LYMPH NODE BIOPSY (Right)  Patient Location: PACU  Anesthesia Type:GA combined with regional for post-op pain  Level of Consciousness: sedated  Airway & Oxygen Therapy: Patient Spontanous Breathing and Patient connected to face mask oxygen  Post-op Assessment: Report given to RN and Post -op Vital signs reviewed and stable  Post vital signs: Reviewed and stable  Last Vitals:  Filed Vitals:   05/27/15 1345 05/27/15 1504  BP: 120/66 104/70  Pulse: 87 91  Temp:    Resp: 16 15    Last Pain: There were no vitals filed for this visit.       Complications: No apparent anesthesia complications

## 2015-05-27 NOTE — Anesthesia Procedure Notes (Addendum)
Anesthesia Regional Block:  Pectoralis block  Pre-Anesthetic Checklist: ,, timeout performed, Correct Patient, Correct Site, Correct Laterality, Correct Procedure, Correct Position, site marked, Risks and benefits discussed,  Surgical consent,  Pre-op evaluation,  At surgeon's request and post-op pain management  Laterality: Right and Upper  Prep: chloraprep       Needles:  Injection technique: Single-shot  Needle Type: Echogenic Needle     Needle Length: 9cm 9 cm Needle Gauge: 21 and 21 G    Additional Needles:  Procedures: ultrasound guided (picture in chart) Pectoralis block Narrative:  Start time: 05/27/2015 1:41 PM End time: 05/27/2015 1:46 PM Injection made incrementally with aspirations every 5 mL.  Performed by: Personally  Anesthesiologist: CREWS, DAVID   Procedure Name: LMA Insertion Date/Time: 05/27/2015 2:13 PM Performed by: Maryella Shivers Pre-anesthesia Checklist: Patient identified, Emergency Drugs available, Suction available and Patient being monitored Patient Re-evaluated:Patient Re-evaluated prior to inductionOxygen Delivery Method: Circle System Utilized Preoxygenation: Pre-oxygenation with 100% oxygen Intubation Type: IV induction Ventilation: Mask ventilation without difficulty LMA: LMA inserted LMA Size: 4.0 Number of attempts: 1 Airway Equipment and Method: Bite block Placement Confirmation: positive ETCO2 Tube secured with: Tape Dental Injury: Teeth and Oropharynx as per pre-operative assessment         Right PEC block image

## 2015-05-27 NOTE — Interval H&P Note (Signed)
History and Physical Interval Note:  05/27/2015 12:54 PM  Renee Wright  has presented today for surgery, with the diagnosis of  RIGHT BREAST CANCER  The various methods of treatment have been discussed with the patient and family. After consideration of risks, benefits and other options for treatment, the patient has consented to  Procedure(s): BREAST LUMPECTOMY WITH RADIOACTIVE SEED AND SENTINEL LYMPH NODE BIOPSY (Right) as a surgical intervention .  The patient's history has been reviewed, patient examined, no change in status, stable for surgery.  I have reviewed the patient's chart and labs.  Questions were answered to the patient's satisfaction.     [name] A.

## 2015-05-27 NOTE — Op Note (Signed)
Preoperative diagnosis: Stage I right breast cancer upper outer quadrant  Postoperative diagnosis: Same  Procedure: Right breast seed localized partial mastectomy with right axillary sentinel lymph node mapping  Surgeon: Erroll Luna M.D.  Anesthesia: Gen. with pectoral block and 0.25% Sensorcaine with epinephrine local  EBL: Minimal  Specimen: Right breast mass was seen clip specimen into right axillary sentinel nodes blue and hot  Drains: None  Indications for procedure: Patient a 45 year old female with stage I right breast cancer. Was met in consultation we discussed breast conservation versus mastectomy and reconstruction. We discussed potential cosmetic issues given her breast size of Braun. After thinking about her options, she opted for right breast lumpectomy and scanning could be cosmetic issues postoperatively. We discussed role of radiation therapy and potential chemotherapy. We discussed sentinel lymph node mapping.The procedure has been discussed with the patient. Alternatives to surgery have been discussed with the patient.  Risks of surgery include bleeding,  Infection,  Seroma formation, death,  and the need for further surgery.   The patient understands and wishes to proceed.Sentinel lymph node mapping and dissection has been discussed with the patient.  Risk of bleeding,  Infection,  Seroma formation,  Additional procedures,,  Shoulder weakness ,  Shoulder stiffness,  Nerve and blood vessel injury and reaction to the mapping dyes have been discussed.  Alternatives to surgery have been discussed with the patient.  The patient agrees to proceed.  Description of procedure: The patient was met in the holding area. Neoprobe was used to localize received. She underwent injection of the right breast by radiology for technetium soft colloid. Questions are answered and she was taken back to the operating room. She was placed upon the operating room table. After induction of general  anesthesia, the right breast and right axilla were prepped and draped in a sterile fashion. Timeout was done. She received appropriate antibiotics. 2 mL of methylene blue admixed with saline was injected under the right nipple massaged. The neoprobe was used and the scene was identified in the right breast upper outer quadrant. Transverse incision was made with ellipse of skin and all tissue around the seed and Clip were excised grossly clear margins. The pectoralis fashion was taken with the specimen. There is no evidence of invasion. Hemostasis was achieved Lucia Gaskins was closed with 3-0 Vicryl and 4-0 Monocryl. The cavity was clipped prior to closure. In a similar fashion neoprobe was used to identify hotspot in the right axilla. Incision made in right axilla. Dissection was carried into the Level One lymph node basin. There were 2 hot blue nodes identified. There were removed. There are no other nodes that I could identify sentinel nodes. Hemostasis was achieved Lucia Gaskins was closed with 3-0 Vicryl and 4-0 Monocryl. Liquid adhesive applied. All final counts found to be correct. The patient was awoke extubated taken to recovery in satisfactory condition.

## 2015-05-27 NOTE — Anesthesia Preprocedure Evaluation (Signed)
Anesthesia Evaluation  Patient identified by MRN, date of birth, ID band Patient awake    Reviewed: Allergy & Precautions, NPO status , Patient's Chart, lab work & pertinent test results  Airway Mallampati: I  TM Distance: >3 FB Neck ROM: Full    Dental  (+) Teeth Intact, Dental Advisory Given   Pulmonary    breath sounds clear to auscultation       Cardiovascular  Rhythm:Regular Rate:Normal     Neuro/Psych    GI/Hepatic   Endo/Other    Renal/GU      Musculoskeletal   Abdominal   Peds  Hematology   Anesthesia Other Findings   Reproductive/Obstetrics                             Anesthesia Physical Anesthesia Plan  ASA: I  Anesthesia Plan: General   Post-op Pain Management: GA combined w/ Regional for post-op pain   Induction: Intravenous  Airway Management Planned: LMA  Additional Equipment:   Intra-op Plan:   Post-operative Plan: Extubation in OR  Informed Consent: I have reviewed the patients History and Physical, chart, labs and discussed the procedure including the risks, benefits and alternatives for the proposed anesthesia with the patient or authorized representative who has indicated his/her understanding and acceptance.   Dental advisory given  Plan Discussed with: CRNA, Anesthesiologist and Surgeon  Anesthesia Plan Comments:         Anesthesia Quick Evaluation

## 2015-05-27 NOTE — H&P (View-Only) (Signed)
Renee Wright. Orf 04/30/2015 10:16 AM Location: [facility] Patient #: [id number] DOB: Jun 16, 1970 Married / Language: English / Race: White Female  History of Present Illness Renee Wright A. Renee Schumacher MD; 04/30/2015 12:47 PM) Patient words: Pt sent at the request of [name] for right breast cancer detected on screening mammogram and core biopsies a 0.6 x 0.4 x 0.5 IDC/DCIS right upper outer quadrant. ER/PR + HER 2 NEU -. Pt denies breast mass, pain or discharge bilaterally.    Patient has had genetic counseling and screening this is negative. She is seeing Dr. [name] of plastic surgery and both lumpectomy and mastectomy options are discussed with reconstruction and potential additional therapies that may be necessary secondary to her small breast size as well as possible cosmetic outcomes.   She has no complaints.  The patient is a 45 year old female.   Allergies Renee Wright, CMA; 04/30/2015 10:16 AM) No Known Drug Allergies 04/30/2015  Medication History (Renee Wright, CMA; 04/30/2015 10:17 AM) Gwenlyn Saran (3-0.02MG  Tablet, Oral) Active. Fluticasone Propionate (50MCG/ACT Suspension, Nasal) Active. Vitamin D (Ergocalciferol) (50000UNIT Capsule, Oral) Active. [name] (3-0.02MG  Tablet, Oral) Active. Medications Reconciled    Vitals (Renee Wright CMA; 04/30/2015 10:16 AM) 04/30/2015 10:16 AM Weight: 125 lb Height: 66in Body Surface Area: 1.64 m Body Mass Index: 20.18 kg/m  Temp.: 17F(Temporal)  Pulse: 76 (Regular)  BP: 124/80 (Sitting, Left Arm, Standard)      Physical Exam (Renee Wright A. Eulogio Requena MD; 04/30/2015 12:47 PM)  General Mental Status-Alert. General Appearance-Consistent with stated age. Hydration-Well hydrated. Voice-Normal.  Head and Neck Head-normocephalic, atraumatic with no lesions or palpable masses.  Breast Note: Small A cup breasts bilaterally. No masses bilaterally.    Assessment & Plan ([name]; 04/30/2015  12:48 PM)  BREAST CANCER, RIGHT (C50.911) Impression: discussed breast conservation with lumpectomy and SLN mapping vs mastectomy and reconstruction, NSM and the pro and cons. Small busted so lumpectomy may cause some mild cosmetic deformity. Risk of lumpectomy include bleeding, infection, seroma, more surgery, use of seed/wire, wound care, cosmetic deformity and the need for other treatments, death , blood clots, death. Pt agrees to proceed. Risk of sentinel lymph node mapping include bleeding, infection, lymphedema, shoulder pain. stiffness, dye allergy. cosmetic deformity , blood clots, death, need for more surgery. Pt agres to proceed.    I will long discussion with the patient and her husband and re-reviewed [name]   She is still a candidate for lumpectomy but she could have cosmetic issues postoperatively. I think with all plastic techniques we can avoid this. I discussed plastic reconstruction options as well as the need for radiation therapy, possible chemotherapy, and lymph node mapping. She still would like to proceed with breast conservation. Risk of lumpectomy include bleeding, infection, seroma, more surgery, use of seed/wire, wound care, cosmetic deformity and the need for other treatments, death , blood clots, death. Pt agrees to proceed. Risk of sentinel lymph node mapping include bleeding, infection, lymphedema, shoulder pain. stiffness, dye allergy. cosmetic deformity , blood clots, death, need for more surgery. Pt agres to proceed.  Current Plans You are being scheduled for surgery - Our schedulers will call you.  You should hear from our office's scheduling department within 5 working days about the location, date, and time of surgery. We try to make accommodations for patient's preferences in scheduling surgery, but sometimes the OR schedule or the surgeon's schedule prevents [place] from making those accommodations.  If you have not heard from our office [phone]) in 5  working days, call the office and ask for your surgeon's nurse.  If you have other questions about your diagnosis, plan, or surgery, call the office and ask for your surgeon's nurse.  We discussed the staging and pathophysiology of breast cancer. We discussed all of the different options for treatment for breast cancer including surgery, chemotherapy, radiation therapy, Herceptin, and antiestrogen therapy. We discussed a sentinel lymph node biopsy as she does not appear to having lymph node involvement right now. We discussed the performance of that with injection of radioactive tracer and blue dye. We discussed that she would have an incision underneath her axillary hairline. We discussed that there is a bout a 10-20% chance of having a positive node with a sentinel lymph node biopsy and we will await the permanent pathology to make any other first further decisions in terms of her treatment. One of these options might be to return to the operating room to perform an axillary lymph node dissection. We discussed about a 1-2% risk lifetime of chronic shoulder pain as well as lymphedema associated with a sentinel lymph node biopsy. We discussed the options for treatment of the breast cancer which included lumpectomy versus a mastectomy. We discussed the performance of the lumpectomy with a wire placement. We discussed a 10-20% chance of a positive margin requiring reexcision in the operating room. We also discussed that she may need radiation therapy or antiestrogen therapy or both if she undergoes lumpectomy. We discussed the mastectomy and the postoperative care for that as well. We discussed that there is no difference in her survival whether she undergoes lumpectomy with radiation therapy or antiestrogen therapy versus a mastectomy. There is a slight difference in the local recurrence rate being 3-5% with lumpectomy and about 1% with a mastectomy. We discussed the risks of operation including bleeding,  infection, possible reoperation. She understands her further therapy will be based on what her stages at the time of her operation.  Pt Education - flb breast cancer surgery: discussed with patient and provided information. Pt Education - [facility] Breast Biopsy HCI: discussed with patient and provided information. Pt Education - ABC (After Breast Cancer) Class Info: discussed with patient and provided information.

## 2015-05-27 NOTE — Anesthesia Postprocedure Evaluation (Signed)
Anesthesia Post Note  Patient: Renee Wright  Procedure(s) Performed: Procedure(s) (LRB): BREAST LUMPECTOMY WITH RADIOACTIVE SEED AND SENTINEL LYMPH NODE BIOPSY (Right)  Patient location during evaluation: PACU Anesthesia Type: General Level of consciousness: awake and alert Pain management: pain level controlled Vital Signs Assessment: post-procedure vital signs reviewed and stable Respiratory status: spontaneous breathing, nonlabored ventilation and respiratory function stable Cardiovascular status: blood pressure returned to baseline and stable Postop Assessment: no signs of nausea or vomiting Anesthetic complications: no    Last Vitals:  Filed Vitals:   05/27/15 1530 05/27/15 1545  BP: 114/76 115/70  Pulse: 83 86  Temp:    Resp: 18 14    Last Pain: There were no vitals filed for this visit.               Adelisa Satterwhite A

## 2015-05-27 NOTE — Discharge Instructions (Signed)
[facility] °Office Phone Number [phone] ° °BREAST BIOPSY/ PARTIAL MASTECTOMY: POST OP INSTRUCTIONS ° °Always review your discharge instruction sheet given to you by the facility where your surgery was performed. ° °IF YOU HAVE DISABILITY OR FAMILY LEAVE FORMS, YOU MUST BRING THEM TO THE OFFICE FOR PROCESSING.  DO NOT GIVE THEM TO YOUR DOCTOR. ° °1. A prescription for pain medication may be given to you upon discharge.  Take your pain medication as prescribed, if needed.  If narcotic pain medicine is not needed, then you may take acetaminophen (Tylenol) or ibuprofen (Advil) as needed. °2. Take your usually prescribed medications unless otherwise directed °3. If you need a refill on your pain medication, please contact your pharmacy.  They will contact our office to request authorization.  Prescriptions will not be filled after 5pm or on week-ends. °4. You should eat very light the first 24 hours after surgery, such as soup, crackers, pudding, etc.  Resume your normal diet the day after surgery. °5. Most patients will experience some swelling and bruising in the breast.  Ice packs and a good support bra will help.  Swelling and bruising can take several days to resolve.  °6. It is common to experience some constipation if taking pain medication after surgery.  Increasing fluid intake and taking a stool softener will usually help or prevent this problem from occurring.  A mild laxative (Milk of Magnesia or Miralax) should be taken according to package directions if there are no bowel movements after 48 hours. °7. Unless discharge instructions indicate otherwise, you may remove your bandages 24-48 hours after surgery, and you may shower at that time.  You may have steri-strips (small skin tapes) in place directly over the incision.  These strips should be left on the skin for 7-10 days.  If your surgeon used skin glue on the incision, you may shower in 24 hours.  The glue will flake off over the  next 2-3 weeks.  Any sutures or staples will be removed at the office during your follow-up visit. °8. ACTIVITIES:  You may resume regular daily activities (gradually increasing) beginning the next day.  Wearing a good support bra or sports bra minimizes pain and swelling.  You may have sexual intercourse when it is comfortable. °a. You may drive when you no longer are taking prescription pain medication, you can comfortably wear a seatbelt, and you can safely maneuver your car and apply brakes. °b. RETURN TO WORK:  ______________________________________________________________________________________ °9. You should see your doctor in the office for a follow-up appointment approximately two weeks after your surgery.  Your doctor’s nurse will typically make your follow-up appointment when she calls you with your pathology report.  Expect your pathology report 2-3 business days after your surgery.  You may call to check if you do not hear from us after three days. °10. OTHER INSTRUCTIONS: _______________________________________________________________________________________________ _____________________________________________________________________________________________________________________________________ °_____________________________________________________________________________________________________________________________________ °_____________________________________________________________________________________________________________________________________ ° °WHEN TO CALL YOUR DOCTOR: °1. Fever over 101.0 °2. Nausea and/or vomiting. °3. Extreme swelling or bruising. °4. Continued bleeding from incision. °5. Increased pain, redness, or drainage from the incision. ° °The clinic staff is available to answer your questions during regular business hours.  Please don’t hesitate to call and ask to speak to one of the nurses for clinical concerns.  If you have a medical emergency, go to the nearest  emergency room or call 911.  A surgeon from [facility] is always on call at the hospital. ° °For further questions, please visit [email or web address]  ° ° ° °  Post Anesthesia Home Care Instructions ° °Activity: °Get plenty of rest for the remainder of the day. A responsible adult should stay with you for 24 hours following the procedure.  °For the next 24 hours, DO NOT: °-Drive a car °-Operate machinery °-Drink alcoholic beverages °-Take any medication unless instructed by your physician °-Make any legal decisions or sign important papers. ° °Meals: °Start with liquid foods such as gelatin or soup. Progress to regular foods as tolerated. Avoid greasy, spicy, heavy foods. If nausea and/or vomiting occur, drink only clear liquids until the nausea and/or vomiting subsides. Call your physician if vomiting continues. ° °Special Instructions/Symptoms: °Your throat may feel dry or sore from the anesthesia or the breathing tube placed in your throat during surgery. If this causes discomfort, gargle with warm salt water. The discomfort should disappear within 24 hours. ° °If you had a scopolamine patch placed behind your ear for the management of post- operative nausea and/or vomiting: ° °1. The medication in the patch is effective for 72 hours, after which it should be removed.  Wrap patch in a tissue and discard in the trash. Wash hands thoroughly with soap and water. °2. You may remove the patch earlier than 72 hours if you experience unpleasant side effects which may include dry mouth, dizziness or visual disturbances. °3. Avoid touching the patch. Wash your hands with soap and water after contact with the patch. °  ° °

## 2015-05-28 ENCOUNTER — Encounter (HOSPITAL_BASED_OUTPATIENT_CLINIC_OR_DEPARTMENT_OTHER): Payer: Self-pay | Admitting: Surgery

## 2015-06-01 ENCOUNTER — Telehealth: Payer: Self-pay | Admitting: *Deleted

## 2015-06-01 NOTE — Telephone Encounter (Signed)
Received order per Dr. Jana Hakim for oncotype testing. Requisition sent and received by pathology.

## 2015-06-11 ENCOUNTER — Encounter (HOSPITAL_COMMUNITY): Payer: Self-pay

## 2015-06-14 ENCOUNTER — Telehealth: Payer: Self-pay | Admitting: Oncology

## 2015-06-14 ENCOUNTER — Telehealth: Payer: Self-pay | Admitting: *Deleted

## 2015-06-14 NOTE — Telephone Encounter (Signed)
Received oncotype score of 17/11%. Pt notified of results. Referral placed for pt to see Dr. Orlene Erm for xrt.

## 2015-06-14 NOTE — Telephone Encounter (Signed)
Telephone call to Dr. Lula Olszewski office to schedule patient's radiation treatment. Left voicemail for Georgette Shell, Dr. Lula Olszewski nurse, to return my call.

## 2015-06-16 ENCOUNTER — Telehealth: Payer: Self-pay | Admitting: *Deleted

## 2015-06-16 ENCOUNTER — Encounter: Payer: Self-pay | Admitting: *Deleted

## 2015-06-16 NOTE — Progress Notes (Signed)
Faxed communication from [facility] stating pt is scheduled

## 2015-06-16 NOTE — Telephone Encounter (Signed)
Faxed communication received from the [facility] stating pt has been scheduled to see Dr Orlene Erm on Friday 06/18/2015.  Above will be forwarded to MD and Nurse Navigator

## 2015-06-21 ENCOUNTER — Telehealth: Payer: Self-pay | Admitting: *Deleted

## 2015-06-21 ENCOUNTER — Telehealth: Payer: Self-pay

## 2015-06-21 NOTE — Telephone Encounter (Signed)
oncotypes came back low. Renee Wright had told her she can cancel tomorrow's appt. Pt has talked with rad onc. She knows the direction of her upcoming management. She wants to cancel tomorrow's appt. Done per pt request. Noted there are no more appts on schedule for the pt.

## 2015-06-21 NOTE — Telephone Encounter (Signed)
Received notification that pt is scheduled for SIM with Dr. Orlene Erm on 06/25/15 Once have xrt schedule, we will get pt back in to see Dr. Jana Hakim

## 2015-06-22 ENCOUNTER — Ambulatory Visit: Payer: BLUE CROSS/BLUE SHIELD | Admitting: Oncology

## 2015-06-22 ENCOUNTER — Other Ambulatory Visit: Payer: Self-pay | Admitting: Oncology

## 2015-06-23 ENCOUNTER — Telehealth: Payer: Self-pay | Admitting: Oncology

## 2015-06-23 NOTE — Telephone Encounter (Signed)
lvm to inform patient of 7/21 appt per GM 6/13 pof at 930 am

## 2015-06-28 ENCOUNTER — Ambulatory Visit: Payer: BLUE CROSS/BLUE SHIELD | Admitting: Oncology

## 2015-07-12 ENCOUNTER — Telehealth: Payer: Self-pay | Admitting: *Deleted

## 2015-07-12 NOTE — Telephone Encounter (Signed)
  Oncology Nurse Navigator Documentation  Navigator Location: [facility] (07/12/15 1100) Navigator Encounter Type: Telephone (07/12/15 1100)           Patient Visit Type: [id number] (07/12/15 1100) Treatment Phase: First Radiation Tx (07/12/15 1100)                            Time Spent with Patient: 15 (07/12/15 1100)

## 2015-07-30 ENCOUNTER — Ambulatory Visit: Payer: BLUE CROSS/BLUE SHIELD | Admitting: Oncology

## 2015-08-26 ENCOUNTER — Telehealth: Payer: Self-pay | Admitting: *Deleted

## 2015-08-26 NOTE — Telephone Encounter (Signed)
Received call from patient stating she needed to change her appointement with Dr> Magrinat.  Confirmed new appointment for 09/08/15 at 10am.

## 2015-09-08 ENCOUNTER — Telehealth: Payer: Self-pay | Admitting: Oncology

## 2015-09-08 ENCOUNTER — Ambulatory Visit (HOSPITAL_BASED_OUTPATIENT_CLINIC_OR_DEPARTMENT_OTHER): Payer: BLUE CROSS/BLUE SHIELD | Admitting: Oncology

## 2015-09-08 VITALS — BP 117/72 | HR 60 | Temp 98.4°F | Resp 18 | Ht 65.0 in | Wt 128.4 lb

## 2015-09-08 DIAGNOSIS — Z170 Estrogen receptor positive status [ER+]: Secondary | ICD-10-CM

## 2015-09-08 DIAGNOSIS — C50811 Malignant neoplasm of overlapping sites of right female breast: Secondary | ICD-10-CM | POA: Diagnosis not present

## 2015-09-08 DIAGNOSIS — C50211 Malignant neoplasm of upper-inner quadrant of right female breast: Secondary | ICD-10-CM

## 2015-09-08 MED ORDER — TAMOXIFEN CITRATE 20 MG PO TABS: 20.0000 mg | ORAL_TABLET | Freq: Every day | ORAL | 12 refills | Status: DC

## 2015-09-08 NOTE — Progress Notes (Signed)
[facility]  Telephone:([phone]) [phone] Fax:(336) [phone]     ID: Renee Wright DOB: 03-Feb-1970  MR#: [id number]  CBU#:[id number]  Patient Care Team: Ronita Hipps, MD as PCP - General (Family Medicine) Olga Millers, MD as PCP - OBGYN (Obstetrics and Gynecology) Chauncey Cruel, MD as Consulting Physician (Oncology) Erroll Luna, MD as Consulting Physician (General Surgery) Eppie Gibson, MD as Attending Physician (Radiation Oncology) Sylvan Cheese, NP as Nurse Practitioner (Hematology and Oncology) PCP: Ronita Hipps, MD OTHER MD:  CHIEF COMPLAINT: Estrogen receptor positive invasive breast cancer   CURRENT TREATMENT: Anti-estrogens   BREAST CANCER HISTORY:  From the original intake note:  Renee Wright had screening mammography 03/02/2015 showing a possible change in her right breast. On 03/15/2015 she underwent right diagnostic mammography with tomosynthesis and right breast ultrasonography at [facility]. Breast density was category C. Mammography confirmed an irregular mass in the upper right breast and draining 0.6 cm, with associated microcalcifications. Ultrasound confirmed an irregular hypoechoic mass at the 12:30 o'clock axis measuring 0.6 cm. The right axilla was sonographically normal.  Biopsy of the right breast mass in question 03/25/2015 showed ([id number]) invasive ductal carcinoma, grade 1, estrogen receptor 90% positive, progesterone receptor 90% positive, both with strong staining intensity, with an MIB-1 of 3%, and HER-2 nonamplified, the signals ratio being 0.9 to and the number per cell 1.75.  Her subsequent history is as detailed below  INTERVAL HISTORY: Renee Wright returns today for follow-up of her estrogen receptor positive breast cancer thanks perfect thank you accompanied by her husband Ulice Dash. Since her last visit here she underwent right lumpectomy and sentinel lymph node sampling, 05/27/2015. The final pathology ([id number])  showed a 0.6 cm invasive ductal carcinoma, grade 1, with negative margins. Repeat HER-2 was again negative with a signals ratio 1.40 and number per cell 2.80. Both sentinel lymph nodes were clear.  We obtained an Oncotype DX score on this sample. He was 17, predicting a risk of recurrence outside the breast within 10 years of 11% if the patient's only systemic therapy is tamoxifen for 5 years. It also predicted no benefit from chemotherapy.  Accordingly she proceeded to radiation in [place] under Dr. Orlene Erm, completed 08/20/2015.  She is now ready to discuss anti-estrogens.  REVIEW OF SYSTEMS: She did generally well with the radiation, although she did have some fatigue and some skin changes. These are clearing. She had started a good walking program prior to the radiation but dropped it during the treatments and has not resumed it. She is experiencing quite a bit of stress at work at present. Note also that she has not had a period since April 2017, when she stopped her oral contraceptives. A detailed review of systems today was otherwise stable Renee Wright 00 Older thanks   PAST MEDICAL HISTORY: Past Medical History:  Diagnosis Date  . Breast calcification, right 01/2011  . Breast cancer ([facility])   . Breast cancer of upper-inner quadrant of right female breast ([facility]) 03/26/2015  . Breast cyst 01/13/2011   Right breast at 7:30 areolar edge   . Family history of breast cancer   . Family history of colon cancer   . Family history of melanoma   . Family history of uterine cancer   . IBS (irritable bowel syndrome)   . PONV (postoperative nausea and vomiting)     PAST SURGICAL HISTORY: Past Surgical History:  Procedure Laterality Date  . BREAST BIOPSY  10/1991  . BREAST BIOPSY  02/02/2011  Procedure: BREAST BIOPSY WITH NEEDLE LOCALIZATION;  Surgeon: Haywood Lasso, MD;  Location: [facility];  Service: General;  Laterality: Right;  removal of right breast calcifications  . BREAST  LUMPECTOMY  01/17/11   benign- right breast (NCB)  . BREAST LUMPECTOMY WITH RADIOACTIVE SEED AND SENTINEL LYMPH NODE BIOPSY Right 05/27/2015   Procedure: BREAST LUMPECTOMY WITH RADIOACTIVE SEED AND SENTINEL LYMPH NODE BIOPSY;  Surgeon: Erroll Luna, MD;  Location: [facility];  Service: General;  Laterality: Right;  . CESAREAN SECTION  05/29/96, 03/30/00    FAMILY HISTORY Family History  Problem Relation Age of Onset  . Breast cancer Maternal Aunt 62  . Uterine cancer Mother 20  . Melanoma Maternal Grandmother 75    mets to bladder  . Colon cancer Paternal Grandmother     dx over 68  The patient's parents are in their mid 59s as of March 2017. The patient's mother was diagnosed with a rare form of uterine cancer at age 57. She underwent hysterectomy and apparently was cured, with no systemic treatment. The patient's mother had 2 sisters, one of them was diagnosed with breast cancer at the age of 47. In addition the patient's maternal grandmother was diagnosed with melanoma at age 28. On the paternal side there is a history of colon cancer in the paternal grandmother at age 11+. There is no history of ovarian cancer in the family  GYNECOLOGIC HISTORY:  No LMP recorded. Menarche age 31 first live birth age 12 the patient is [place] P2. She still having regular periods. She has been on oral contraceptives for 31 years. She was asked to discontinue these at the time of breast cancer diagnosis in March 2017.  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 Her husband J is self-employed in Theme park manager. Daughter Noah Delaine lives in [place] and is a Electronics engineer in business. Son Ovid Curd also lives at home. He is in high school.    ADVANCED DIRECTIVES: Not in place   HEALTH MAINTENANCE: Social History  Substance Use Topics  . Smoking status: Never Smoker  . Smokeless tobacco: Never Used  . Alcohol use Yes     Colonoscopy:  IOX:BDZHGDJM 2017  Bone  density:  Lipid panel:  No Known Allergies  Current Outpatient Prescriptions  Medication Sig Dispense Refill  . oxyCODONE-acetaminophen (ROXICET) 5-325 MG tablet Take 1-2 tablets by mouth every 4 (four) hours as needed. 30 tablet 0   No current facility-administered medications for this visit.     OBJECTIVE: Middle-aged white woman In no acute distress  Vitals:   09/08/15 1021  BP: 117/72  Pulse: 60  Resp: 18  Temp: 98.4 F (36.9 C)     Body mass index is 21.37 kg/m.    ECOG FS:1 - Symptomatic but completely ambulatory  Sclerae unicteric, pupils round and equal Oropharynx clear and moist-- no thrush or other lesions No cervical or supraclavicular adenopathy Lungs no rales or rhonchi Heart regular rate and rhythm Abd soft, nontender, positive bowel sounds MSK no focal spinal tenderness, no upper extremity lymphedema Neuro: nonfocal, well oriented, appropriate affect Breasts: The right breast is status post lumpectomy and radiation. The incisions have healed very nicely and the erythema from the radiation is almost completely resolved. The cosmetic result is good. The right axilla is benign.    LAB RESULTS:  CMP     Component Value Date/Time   NA 143 05/21/2015 1400   NA 141 03/31/2015 1218  K 3.8 05/21/2015 1400   K 3.7 03/31/2015 1218   CL 106 05/21/2015 1400   CO2 28 05/21/2015 1400   CO2 26 03/31/2015 1218   GLUCOSE 72 05/21/2015 1400   GLUCOSE 89 03/31/2015 1218   BUN 9 05/21/2015 1400   BUN 9.8 03/31/2015 1218   CREATININE 0.79 05/21/2015 1400   CREATININE 0.9 03/31/2015 1218   CALCIUM 9.4 05/21/2015 1400   CALCIUM 9.2 03/31/2015 1218   PROT 6.5 05/21/2015 1400   PROT 7.0 03/31/2015 1218   ALBUMIN 3.7 05/21/2015 1400   ALBUMIN 3.5 03/31/2015 1218   AST 47 (H) 05/21/2015 1400   AST 27 03/31/2015 1218   ALT 26 05/21/2015 1400   ALT <9 03/31/2015 1218   ALKPHOS 54 05/21/2015 1400   ALKPHOS 49 03/31/2015 1218   BILITOT 1.0 05/21/2015 1400   BILITOT  0.74 03/31/2015 1218   GFRNONAA >60 05/21/2015 1400   GFRAA >60 05/21/2015 1400    INo results found for: SPEP, UPEP  Lab Results  Component Value Date   WBC 5.6 03/31/2015   NEUTROABS 3.5 03/31/2015   HGB 12.8 03/31/2015   HCT 39.3 03/31/2015   MCV 85.7 03/31/2015   PLT 219 03/31/2015      Chemistry      Component Value Date/Time   NA 143 05/21/2015 1400   NA 141 03/31/2015 1218   K 3.8 05/21/2015 1400   K 3.7 03/31/2015 1218   CL 106 05/21/2015 1400   CO2 28 05/21/2015 1400   CO2 26 03/31/2015 1218   BUN 9 05/21/2015 1400   BUN 9.8 03/31/2015 1218   CREATININE 0.79 05/21/2015 1400   CREATININE 0.9 03/31/2015 1218      Component Value Date/Time   CALCIUM 9.4 05/21/2015 1400   CALCIUM 9.2 03/31/2015 1218   ALKPHOS 54 05/21/2015 1400   ALKPHOS 49 03/31/2015 1218   AST 47 (H) 05/21/2015 1400   AST 27 03/31/2015 1218   ALT 26 05/21/2015 1400   ALT <9 03/31/2015 1218   BILITOT 1.0 05/21/2015 1400   BILITOT 0.74 03/31/2015 1218       No results found for: LABCA2  No components found for: LABCA125  No results for input(s): INR in the last 168 hours.  Urinalysis No results found for: COLORURINE, APPEARANCEUR, LABSPEC, PHURINE, GLUCOSEU, HGBUR, BILIRUBINUR, KETONESUR, PROTEINUR, UROBILINOGEN, NITRITE, LEUKOCYTESUR    ELIGIBLE FOR AVAILABLE RESEARCH PROTOCOL: No  STUDIES: No results found.  ASSESSMENT: 45 y.o. BRCA negative [place] woman status post right breast biopsy 03/25/2015 for a clinical T1BN0 invasive ductal carcinoma, grade 1, estrogen and progesterone receptor positive, HER-2 nonamplified, with an MIB-1 of 3%  (1) status post right lumpectomy and sentinel lymph node sampling 05/27/2015 for a pT1b pN0, stage IA invasive ductal carcinoma, grade 1, repeat HER-2 again negative.  (2) Oncotype DX score of 17 predicts a risk of recurrence outside the breast within 10 years of 11% if the patient's only systemic therapy is tamoxifen for 5 years  (3)  adjuvant radiation  completed 08/20/2015   (4) to start tamoxifen 09/24/2015   (5) genetics testing 04/13/2015 through the Breast/Ovarian/Endometrial cancer genes. The Custom gene panel offered by [facility] includes sequencing and rearrangement analysis for the following 22 genes:  ATM, BARD1, BRCA1, BRCA2, BRIP1, CDH1, CHEK2, EPCAM, FANCC, MLH1, MSH2, MSH6, MUTYH, NBN, PALB2, PMS2, POLD1, PTEN, RAD51C, RAD51D, TP53, and XRCC2.    PLAN: We spent approximately 40 minutes today going over her pathology report and her Oncotype reports that she has a  good understanding of her prognosis, which is very good. She understands why she did not need chemotherapy and what the benefit is of taking tamoxifen for 5 years.  We then discussed the difference between tamoxifen and anastrozole. _0 @   has completed her local treatment and is now ready to start anti-estrogens. She understands that anastrozole and the aromatase inhibitors in general work by blocking estrogen production. Accordingly vaginal dryness, decrease in bone density, and of course hot flashes can result. The aromatase inhibitors can also negatively affect the cholesterol profile, although that is a minor effect. One out of 5 women on aromatase inhibitors we will feel "old and achy". This arthralgia/myalgia syndrome, which resembles fibromyalgia clinically, does resolve with stopping the medications. Accordingly this is not a reason to not try an aromatase inhibitor but it is a frequent reason to stop it (in other words 20% of women will not be able to tolerate these medications).  Tamoxifen on the other hand does not block estrogen production. It does not "take away a woman's estrogen". It blocks the estrogen receptor in breast cells. Like anastrozole, it can also cause hot flashes. As opposed to anastrozole, tamoxifen has many estrogen-like effects. It is technically an estrogen receptor modulator. This means that in some tissues tamoxifen  works like estrogen-- for example it helps strengthen the bones. It tends to improve the cholesterol profile. It can cause thickening of the endometrial lining, and even endometrial polyps or rarely cancer of the uterus.(The risk of uterine cancer due to tamoxifen is one additional cancer per thousand women year). It can cause vaginal wetness or stickiness. It can cause blood clots through this estrogen-like effect--the risk of blood clots with tamoxifen is exactly the same as with birth control pills or hormone replacement.  Neither of these agents causes mood changes or weight gain, despite the popular belief that they can have these side effects. We have data from studies comparing either of these drugs with placebo, and in those cases the control group had the same amount of weight gain and depression as the group that took the drug.  Generally in a young woman like her we start with tamoxifen for 2 years and if she is clearly postmenopausal after that we consider switching to an aromatase inhibitor so that she can obtain the 2-3% extra risk reduction above and beyond what is predicted by her Oncotype. Your going to start the tamoxifen on 09/24/2015 and she will see me again early December to make sure she tolerates it well.  She understands that tamoxifen is not a contraceptive and she is considering having her husband undergo vasectomy. I would be comfortable with either a copper IUD or a Mirena IUD and she can discuss this with Dr. Ouida Sills at their next visit  She knows to call for any problems that may develop before then.   Chauncey Cruel, MD   09/08/2015 10:33 AM Medical Oncology and Hematology [facility] [address] [place],  [address] Tel. [phone]    Fax. [phone]

## 2015-09-08 NOTE — Telephone Encounter (Signed)
appt made and avs printed °

## 2015-09-16 ENCOUNTER — Ambulatory Visit: Payer: BLUE CROSS/BLUE SHIELD | Admitting: Oncology

## 2015-11-26 ENCOUNTER — Encounter (HOSPITAL_COMMUNITY): Payer: Self-pay | Admitting: Emergency Medicine

## 2015-11-26 ENCOUNTER — Emergency Department (HOSPITAL_COMMUNITY): Payer: BLUE CROSS/BLUE SHIELD

## 2015-11-26 ENCOUNTER — Telehealth: Payer: Self-pay | Admitting: *Deleted

## 2015-11-26 ENCOUNTER — Emergency Department (HOSPITAL_COMMUNITY)
Admission: EM | Admit: 2015-11-26 | Discharge: 2015-11-26 | Disposition: A | Discharge status: Home / Self Care | Payer: BLUE CROSS/BLUE SHIELD | Attending: Physician Assistant | Admitting: Physician Assistant

## 2015-11-26 DIAGNOSIS — C50211 Malignant neoplasm of upper-inner quadrant of right female breast: Secondary | ICD-10-CM | POA: Insufficient documentation

## 2015-11-26 DIAGNOSIS — Z79899 Other long term (current) drug therapy: Secondary | ICD-10-CM | POA: Diagnosis not present

## 2015-11-26 DIAGNOSIS — R0789 Other chest pain: Secondary | ICD-10-CM

## 2015-11-26 DIAGNOSIS — R079 Chest pain, unspecified: Secondary | ICD-10-CM | POA: Diagnosis present

## 2015-11-26 LAB — CBC
HCT: 41.7 % (ref 36.0–46.0)
HEMOGLOBIN: 13.8 g/dL (ref 12.0–15.0)
MCH: 28.9 pg (ref 26.0–34.0)
MCHC: 33.1 g/dL (ref 30.0–36.0)
MCV: 87.2 fL (ref 78.0–100.0)
PLATELETS: 204 10*3/uL (ref 150–400)
RBC: 4.78 MIL/uL (ref 3.87–5.11)
RDW: 13.5 % (ref 11.5–15.5)
WBC: 6 10*3/uL (ref 4.0–10.5)

## 2015-11-26 LAB — I-STAT TROPONIN, ED: Troponin i, poc: 0 ng/mL (ref 0.00–0.08)

## 2015-11-26 LAB — BASIC METABOLIC PANEL
Anion gap: 8 (ref 5–15)
BUN: 11 mg/dL (ref 6–20)
CHLORIDE: 106 mmol/L (ref 101–111)
CO2: 25 mmol/L (ref 22–32)
CREATININE: 0.97 mg/dL (ref 0.44–1.00)
Calcium: 9.5 mg/dL (ref 8.9–10.3)
GFR calc non Af Amer: >60 mL/min (ref >60)
GLUCOSE: 107 mg/dL — AB (ref 65–99)
Potassium: 3.4 mmol/L — ABNORMAL LOW (ref 3.5–5.1)
Sodium: 139 mmol/L (ref 135–145)

## 2015-11-26 MED ORDER — IBUPROFEN 800 MG PO TABS: 800.0000 mg | ORAL_TABLET | Freq: Three times a day (TID) | ORAL | 0 refills | Status: DC | PRN

## 2015-11-26 MED ORDER — IOPAMIDOL (ISOVUE-370) INJECTION 76%
100.0000 mL | Freq: Once | INTRAVENOUS | Status: AC | PRN
  Administered 2015-11-26: 74.1 mL via INTRAVENOUS

## 2015-11-26 NOTE — Telephone Encounter (Signed)
"  I need to speak with Navigator for breast cancer patient's.  I had lumpectomyt, radiation and having pain under area where surgery was."  Call transferred.

## 2015-11-26 NOTE — ED Triage Notes (Signed)
Pt complaint of right chest pain worse with deep breath for a week; pt has right breast cancer; here for r/o blood clot.

## 2015-11-26 NOTE — Discharge Instructions (Signed)
Return here as needed.  Follow-up with your doctor.  There was a small nodule noted on the CT scan that does not necessarily mean anything significant, but will need to be followed

## 2015-11-26 NOTE — ED Provider Notes (Signed)
[facility] DEPT Provider Note   CSN: [id number] Arrival date & time: 11/26/15  1653     History   Chief Complaint Chief Complaint  Patient presents with  . Chest Pain  . Cancer    HPI Renee Wright is a 45 y.o. female.  HPI Patient presents to the emergency department with right-sided chest pain that is worse with certain movements and deep breathing.  The patient states that she was concerned she has had breast cancer surgery in that region.  She states that the pain has been there for 1 week.  Patient states nothing seems make the condition better.  She states that she was sent here by her doctor for evaluation of blood clotThe patient denies shortness of breath, headache,blurred vision, neck pain, fever, cough, weakness, numbness, dizziness, anorexia, edema, abdominal pain, nausea, vomiting, diarrhea, rash, back pain, dysuria, hematemesis, bloody stool, near syncope, or syncope. Past Medical History:  Diagnosis Date  . Breast calcification, right 01/2011  . Breast cancer ([facility])   . Breast cancer of upper-inner quadrant of right female breast ([facility]) 03/26/2015  . Breast cyst 01/13/2011   Right breast at 7:30 areolar edge   . Family history of breast cancer   . Family history of colon cancer   . Family history of melanoma   . Family history of uterine cancer   . IBS (irritable bowel syndrome)   . PONV (postoperative nausea and vomiting)     Patient Active Problem List   Diagnosis Date Noted  . Genetic testing 04/14/2015  . Family history of breast cancer   . Family history of uterine cancer   . Family history of melanoma   . Family history of colon cancer   . Breast cancer of upper-inner quadrant of right female breast ([facility]) 03/26/2015  . Parasomnia, organic 10/19/2014  . Nightmares REM-sleep type 10/19/2014  . Irritable bowel syndrome with both constipation and diarrhea 10/19/2014  . Breast cyst 01/13/2011    Past Surgical History:  Procedure Laterality Date  .  BREAST BIOPSY  10/1991  . BREAST BIOPSY  02/02/2011   Procedure: BREAST BIOPSY WITH NEEDLE LOCALIZATION;  Surgeon: Haywood Lasso, MD;  Location: [facility];  Service: General;  Laterality: Right;  removal of right breast calcifications  . BREAST LUMPECTOMY  01/17/11   benign- right breast (NCB)  . BREAST LUMPECTOMY WITH RADIOACTIVE SEED AND SENTINEL LYMPH NODE BIOPSY Right 05/27/2015   Procedure: BREAST LUMPECTOMY WITH RADIOACTIVE SEED AND SENTINEL LYMPH NODE BIOPSY;  Surgeon: Erroll Luna, MD;  Location: [facility];  Service: General;  Laterality: Right;  . CESAREAN SECTION  05/29/96, 03/30/00    OB History    No data available       Home Medications    Prior to Admission medications   Medication Sig Start Date End Date Taking? Authorizing Provider  ibuprofen (ADVIL,MOTRIN) 200 MG tablet Take 400 mg by mouth every 6 (six) hours as needed for moderate pain.   Yes Historical Provider, MD  tamoxifen (NOLVADEX) 20 MG tablet TAKE 1 TABLET (20 MG TOTAL) BY MOUTH EVERY EVENING 09/08/15  Yes Historical Provider, MD    Family History Family History  Problem Relation Age of Onset  . Breast cancer Maternal Aunt 62  . Uterine cancer Mother 73  . Melanoma Maternal Grandmother 75    mets to bladder  . Colon cancer Paternal Grandmother     dx over 74    Social History Social History  Substance Use Topics  .  Smoking status: Never Smoker  . Smokeless tobacco: Never Used  . Alcohol use Yes     Allergies   Patient has no known allergies.   Review of Systems Review of Systems All other systems negative except as documented in the HPI. All pertinent positives and negatives as reviewed in the HPI.  Physical Exam Updated Vital Signs BP 110/67 (BP Location: Left Arm)   Pulse 71   Temp 98 F (36.7 C) (Oral)   Resp 14   Wt 57.2 kg   LMP 04/10/2015 (LMP Unknown)   SpO2 100%   BMI 20.97 kg/m   Physical Exam  Constitutional: She is oriented to  person, place, and time. She appears well-developed and well-nourished. No distress.  HENT:  Head: Normocephalic and atraumatic.  Mouth/Throat: Oropharynx is clear and moist.  Eyes: Pupils are equal, round, and reactive to light.  Neck: Normal range of motion. Neck supple.  Cardiovascular: Normal rate, regular rhythm and normal heart sounds.  Exam reveals no gallop and no friction rub.   No murmur heard. Pulmonary/Chest: Effort normal and breath sounds normal. No respiratory distress. She has no wheezes. She exhibits no tenderness.  Abdominal: Soft. Bowel sounds are normal. She exhibits no distension. There is no tenderness.  Neurological: She is alert and oriented to person, place, and time. She exhibits normal muscle tone. Coordination normal.  Skin: Skin is warm and dry. No rash noted. No erythema.  Psychiatric: She has a normal mood and affect. Her behavior is normal.  Nursing note and vitals reviewed.    ED Treatments / Results  Labs (all labs ordered are listed, but only abnormal results are displayed) Labs Reviewed  BASIC METABOLIC PANEL - Abnormal; Notable for the following:       Result Value   Potassium 3.4 (*)    Glucose, Bld 107 (*)    All other components within normal limits  CBC  I-STAT TROPOININ, ED    EKG  EKG Interpretation None       Radiology Dg Chest 2 View  Result Date: 11/26/2015 CLINICAL DATA:  Right side chest pain for 1 week. History of breast cancer. EXAM: CHEST  2 VIEW COMPARISON:  None. FINDINGS: Heart and mediastinal contours are within normal limits. No focal opacities or effusions. No acute bony abnormality. IMPRESSION: No active cardiopulmonary disease. Electronically Signed   By: Rolm Baptise M.D.   On: 11/26/2015 18:08   Ct Angio Chest Pe W/cm &/or Wo Cm  Result Date: 11/26/2015 CLINICAL DATA:  Chest pain and shortness of breath EXAM: CT ANGIOGRAPHY CHEST WITH CONTRAST TECHNIQUE: Multidetector CT imaging of the chest was performed using  the standard protocol during bolus administration of intravenous contrast. Multiplanar CT image reconstructions and MIPs were obtained to evaluate the vascular anatomy. CONTRAST:  100 mL Isovue 370 nonionic COMPARISON:  Chest radiograph November 26, 2015 FINDINGS: Cardiovascular: There is no demonstrable pulmonary embolus. There is no thoracic aortic aneurysm or dissection. The visualized great vessels appear unremarkable. Pericardium is not appreciably thickened. Mediastinum/Nodes: Visualized thyroid appears normal. There is no appreciable thoracic adenopathy. Lungs/Pleura: There is mild bibasilar atelectasis. There is no parenchymal lung edema or consolidation. On axial slice 42 series 7, there is a 2 mm nodular opacity in the periphery of the superior segment of the right lower lobe. Upper Abdomen: There are small probable cysts in the liver. Visualized upper abdominal structures otherwise appear unremarkable. Musculoskeletal: There is degenerative change in the lower thoracic spine. There are no blastic or lytic bone  lesions. Review of the MIP images confirms the above findings. IMPRESSION: No demonstrable pulmonary embolus. No edema or consolidation. 2 mm nodular opacity in the superior segment right lower lobe. No follow-up needed if patient is low-risk. Non-contrast chest CT can be considered in 12 months if patient is high-risk. This recommendation follows the consensus statement: Guidelines for Management of Incidental Pulmonary Nodules Detected on CT Images: From the Fleischner Society 2017; Radiology 2017; 284:228-243. No evident adenopathy. Electronically Signed   By: Lowella Grip III M.D.   On: 11/26/2015 20:11    Procedures Procedures (including critical care time)  Medications Ordered in ED Medications  iopamidol (ISOVUE-370) 76 % injection 100 mL (74.1 mLs Intravenous Contrast Given 11/26/15 1947)     Initial Impression / Assessment and Plan / ED Course  I have reviewed the triage  vital signs and the nursing notes.  Pertinent labs & imaging results that were available during my care of the patient were reviewed by me and considered in my medical decision making (see chart for details).  Clinical Course     The patient will be treated for chest wall strain and pain.  Told to return here as needed.  Told to follow up with her primary care doctor and oncologist and return here as needed.  Patient has negative CT for PE.  Patient is advised no nodule noted on the CT scan  Final Clinical Impressions(s) / ED Diagnoses   Final diagnoses:  None    New Prescriptions New Prescriptions   No medications on file     Dalia Heading, PA-C 11/26/15 2035    Courteney Julio Alm, MD 11/26/15 2223

## 2015-11-26 NOTE — Telephone Encounter (Signed)
Spoke with Navigator who received message, working on patient's request.

## 2015-11-26 NOTE — Telephone Encounter (Signed)
Received call from patient complaining of chest pain when breathing.  She states it has progressively worse from yesterday evening.  Patient is taking tamoxifen.  Instructed her to go to ED to be evaluated.

## 2015-12-15 ENCOUNTER — Other Ambulatory Visit (HOSPITAL_BASED_OUTPATIENT_CLINIC_OR_DEPARTMENT_OTHER): Payer: BLUE CROSS/BLUE SHIELD

## 2015-12-15 ENCOUNTER — Other Ambulatory Visit: Payer: Self-pay | Admitting: *Deleted

## 2015-12-15 ENCOUNTER — Ambulatory Visit (HOSPITAL_BASED_OUTPATIENT_CLINIC_OR_DEPARTMENT_OTHER): Payer: BLUE CROSS/BLUE SHIELD | Admitting: Oncology

## 2015-12-15 VITALS — BP 101/58 | HR 57 | Temp 98.1°F | Resp 18 | Ht 65.0 in | Wt 129.7 lb

## 2015-12-15 DIAGNOSIS — C50211 Malignant neoplasm of upper-inner quadrant of right female breast: Secondary | ICD-10-CM

## 2015-12-15 DIAGNOSIS — Z170 Estrogen receptor positive status [ER+]: Secondary | ICD-10-CM | POA: Diagnosis not present

## 2015-12-15 DIAGNOSIS — Z79810 Long term (current) use of selective estrogen receptor modulators (SERMs): Secondary | ICD-10-CM

## 2015-12-15 LAB — CBC WITH DIFFERENTIAL/PLATELET
BASO%: 0.9 % (ref 0.0–2.0)
BASOS ABS: 0 10*3/uL (ref 0.0–0.1)
EOS%: 0.8 % (ref 0.0–7.0)
Eosinophils Absolute: 0 10*3/uL (ref 0.0–0.5)
HEMATOCRIT: 39.7 % (ref 34.8–46.6)
HGB: 13.1 g/dL (ref 11.6–15.9)
LYMPH#: 1.2 10*3/uL (ref 0.9–3.3)
LYMPH%: 25.7 % (ref 14.0–49.7)
MCH: 29.1 pg (ref 25.1–34.0)
MCHC: 33 g/dL (ref 31.5–36.0)
MCV: 88.2 fL (ref 79.5–101.0)
MONO#: 0.5 10*3/uL (ref 0.1–0.9)
MONO%: 11.1 % (ref 0.0–14.0)
NEUT#: 2.8 10*3/uL (ref 1.5–6.5)
NEUT%: 61.5 % (ref 38.4–76.8)
PLATELETS: 179 10*3/uL (ref 145–400)
RBC: 4.51 10*6/uL (ref 3.70–5.45)
RDW: 14.2 % (ref 11.2–14.5)
WBC: 4.6 10*3/uL (ref 3.9–10.3)

## 2015-12-15 LAB — COMPREHENSIVE METABOLIC PANEL
ALT: 12 U/L (ref 0–55)
ANION GAP: 8 meq/L (ref 3–11)
AST: 37 U/L — AB (ref 5–34)
Albumin: 3.5 g/dL (ref 3.5–5.0)
Alkaline Phosphatase: 89 U/L (ref 40–150)
BUN: 10.1 mg/dL (ref 7.0–26.0)
CALCIUM: 9.1 mg/dL (ref 8.4–10.4)
CHLORIDE: 108 meq/L (ref 98–109)
CO2: 26 mEq/L (ref 22–29)
Creatinine: 0.8 mg/dL (ref 0.6–1.1)
Glucose: 94 mg/dl (ref 70–140)
POTASSIUM: 3.8 meq/L (ref 3.5–5.1)
Sodium: 142 mEq/L (ref 136–145)
Total Bilirubin: 0.79 mg/dL (ref 0.20–1.20)
Total Protein: 6.7 g/dL (ref 6.4–8.3)

## 2015-12-15 NOTE — Progress Notes (Signed)
[place]  [phone] [phone]     ID: EMA HEBNER DOB: 1970/08/12  MR#: [id number]  OZD#:[id number]  Patient Care Team: Ronita Hipps, MD as PCP - General (Family Medicine) Olga Millers, MD as PCP - OBGYN (Obstetrics and Gynecology) Chauncey Cruel, MD as Consulting Physician (Oncology) Erroll Luna, MD as Consulting Physician (General Surgery) Eppie Gibson, MD as Attending Physician (Radiation Oncology) Sylvan Cheese, NP as Nurse Practitioner (Hematology and Oncology) PCP: Ronita Hipps, MD OTHER MD:  CHIEF COMPLAINT: Estrogen receptor positive invasive breast cancer   CURRENT TREATMENT: Tamoxifen   BREAST CANCER HISTORY:  From the original intake note:  Kiele had screening mammography 03/02/2015 showing a possible change in her right breast. On 03/15/2015 she underwent right diagnostic mammography with tomosynthesis and right breast ultrasonography at the [facility]. Breast density was category C. Mammography confirmed an irregular mass in the upper right breast and draining 0.6 cm, with associated microcalcifications. Ultrasound confirmed an irregular hypoechoic mass at the 12:30 o'clock axis measuring 0.6 cm. The right axilla was sonographically normal.  Biopsy of the right breast mass in question 03/25/2015 showed ([id number]) invasive ductal carcinoma, grade 1, estrogen receptor 90% positive, progesterone receptor 90% positive, both with strong staining intensity, with an MIB-1 of 3%, and HER-2 nonamplified, the signals ratio being 0.9 to and the number per cell 1.75.  Her subsequent history is as detailed below  INTERVAL HISTORY: Shatoya returns today for follow-up of her estrogen receptor positive breast cancer accompanied by her husband Ulice Dash. Hallee has been on tamoxifen for the past 3 months. She is tolerating it remarkably well. She has no problems with hot flashes or vaginal dryness and currently she is  obtaining it at  Since her last visit here she was evaluated in the emergency room for chest pain. She had a chest x-ray and a CT angiogram 11/26/2015, which showed no evidence of disease. The discomfort she was experiencing, which did have a pleuritic component, has completely resolved with no intervention.  Since her last visit she also had genetics testing which showed no evidence of a deleterious mutation  REVIEW OF SYSTEMS: A detailed review of systems today was otherwise noncontributory  PAST MEDICAL HISTORY: Past Medical History:  Diagnosis Date  . Breast calcification, right 01/2011  . Breast cancer ([place])   . Breast cancer of upper-inner quadrant of right female breast ([place]) 03/26/2015  . Breast cyst 01/13/2011   Right breast at 7:30 areolar edge   . Family history of breast cancer   . Family history of colon cancer   . Family history of melanoma   . Family history of uterine cancer   . IBS (irritable bowel syndrome)   . PONV (postoperative nausea and vomiting)     PAST SURGICAL HISTORY: Past Surgical History:  Procedure Laterality Date  . BREAST BIOPSY  10/1991  . BREAST BIOPSY  02/02/2011   Procedure: BREAST BIOPSY WITH NEEDLE LOCALIZATION;  Surgeon: Haywood Lasso, MD;  Location: [facility];  Service: General;  Laterality: Right;  removal of right breast calcifications  . BREAST LUMPECTOMY  01/17/11   benign- right breast (NCB)  . BREAST LUMPECTOMY WITH RADIOACTIVE SEED AND SENTINEL LYMPH NODE BIOPSY Right 05/27/2015   Procedure: BREAST LUMPECTOMY WITH RADIOACTIVE SEED AND SENTINEL LYMPH NODE BIOPSY;  Surgeon: Erroll Luna, MD;  Location: [facility];  Service: General;  Laterality: Right;  . CESAREAN SECTION  05/29/96, 03/30/00    FAMILY HISTORY  Family History  Problem Relation Age of Onset  . Breast cancer Maternal Aunt 62  . Uterine cancer Mother 58  . Melanoma Maternal Grandmother 75    mets to bladder  . Colon cancer Paternal  Grandmother     dx over 13  The patient's parents are in their mid 70s as of March 2017. The patient's mother was diagnosed with a rare form of uterine cancer at age 87. She underwent hysterectomy and apparently was cured, with no systemic treatment. The patient's mother had 2 sisters, one of them was diagnosed with breast cancer at the age of 59. In addition the patient's maternal grandmother was diagnosed with melanoma at age 28. On the paternal side there is a history of colon cancer in the paternal grandmother at age 46+. There is no history of ovarian cancer in the family  GYNECOLOGIC HISTORY:  Patient's last menstrual period was 04/10/2015 (lmp unknown). Menarche age 89 first live birth age 20 the patient is [place] P2. She still having regular periods. She has been on oral contraceptives for 31 years. She was asked to discontinue these at the time of breast cancer diagnosis in March 2017.  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 Her husband J is self-employed in Theme park manager. Daughter Noah Delaine lives in [place] and is a Electronics engineer in business. Son Ovid Curd also lives at home. He is in high school.    ADVANCED DIRECTIVES: Not in place   HEALTH MAINTENANCE: Social History  Substance Use Topics  . Smoking status: Never Smoker  . Smokeless tobacco: Never Used  . Alcohol use Yes     Colonoscopy:  WYO:[id number] 2017  Bone density:  Lipid panel:  No Known Allergies  Current Outpatient Prescriptions  Medication Sig Dispense Refill  . ibuprofen (ADVIL,MOTRIN) 800 MG tablet Take 1 tablet (800 mg total) by mouth every 8 (eight) hours as needed. 21 tablet 0  . tamoxifen (NOLVADEX) 20 MG tablet TAKE 1 TABLET (20 MG TOTAL) BY MOUTH EVERY EVENING  12   No current facility-administered medications for this visit.     OBJECTIVE: Middle-aged white woman who appears well Vitals:   12/15/15 1351  BP: (!) 101/58  Pulse: (!) 57  Resp: 18  Temp: 98.1 F  (36.7 C)     Body mass index is 21.58 kg/m.    ECOG FS:0 - Asymptomatic  Sclerae unicteric, EOMs intact Oropharynx clear and moist No cervical or supraclavicular adenopathy Lungs no rales or rhonchi Heart regular rate and rhythm Abd soft, nontender, positive bowel sounds MSK no focal spinal tenderness, no upper extremity lymphedema Neuro: nonfocal, well oriented, appropriate affect Breasts: She is status post right lumpectomy followed by radiation with no evidence of disease recurrence. There is very minimal hyperpigmentation from the radiation remaining. The right axilla is benign. The left breast is unremarkable.   LAB RESULTS:  CMP     Component Value Date/Time   NA 142 12/15/2015 1319   K 3.8 12/15/2015 1319   CL 106 11/26/2015 1721   CO2 26 12/15/2015 1319   GLUCOSE 94 12/15/2015 1319   BUN 10.1 12/15/2015 1319   CREATININE 0.8 12/15/2015 1319   CALCIUM 9.1 12/15/2015 1319   PROT 6.7 12/15/2015 1319   ALBUMIN 3.5 12/15/2015 1319   AST 37 (H) 12/15/2015 1319   ALT 12 12/15/2015 1319   ALKPHOS 89 12/15/2015 1319   BILITOT 0.79 12/15/2015 1319   GFRNONAA >60 11/26/2015 1721   GFRAA >60 11/26/2015  [place] results found for: SPEP, UPEP  Lab Results  Component Value Date   WBC 4.6 12/15/2015   NEUTROABS 2.8 12/15/2015   HGB 13.1 12/15/2015   HCT 39.7 12/15/2015   MCV 88.2 12/15/2015   PLT 179 12/15/2015      Chemistry      Component Value Date/Time   NA 142 12/15/2015 1319   K 3.8 12/15/2015 1319   CL 106 11/26/2015 1721   CO2 26 12/15/2015 1319   BUN 10.1 12/15/2015 1319   CREATININE 0.8 12/15/2015 1319      Component Value Date/Time   CALCIUM 9.1 12/15/2015 1319   ALKPHOS 89 12/15/2015 1319   AST 37 (H) 12/15/2015 1319   ALT 12 12/15/2015 1319   BILITOT 0.79 12/15/2015 1319       No results found for: LABCA2  No components found for: LABCA125  No results for input(s): INR in the last 168 hours.  Urinalysis No results found for:  COLORURINE, APPEARANCEUR, LABSPEC, PHURINE, GLUCOSEU, HGBUR, BILIRUBINUR, KETONESUR, PROTEINUR, UROBILINOGEN, NITRITE, LEUKOCYTESUR    ELIGIBLE FOR AVAILABLE RESEARCH PROTOCOL: No  STUDIES: Dg Chest 2 View  Result Date: 11/26/2015 CLINICAL DATA:  Right side chest pain for 1 week. History of breast cancer. EXAM: CHEST  2 VIEW COMPARISON:  None. FINDINGS: Heart and mediastinal contours are within normal limits. No focal opacities or effusions. No acute bony abnormality. IMPRESSION: No active cardiopulmonary disease. Electronically Signed   By: Rolm Baptise M.D.   On: 11/26/2015 18:08   Ct Angio Chest Pe W/cm &/or Wo Cm  Result Date: 11/26/2015 CLINICAL DATA:  Chest pain and shortness of breath EXAM: CT ANGIOGRAPHY CHEST WITH CONTRAST TECHNIQUE: Multidetector CT imaging of the chest was performed using the standard protocol during bolus administration of intravenous contrast. Multiplanar CT image reconstructions and MIPs were obtained to evaluate the vascular anatomy. CONTRAST:  100 mL Isovue 370 nonionic COMPARISON:  Chest radiograph November 26, 2015 FINDINGS: Cardiovascular: There is no demonstrable pulmonary embolus. There is no thoracic aortic aneurysm or dissection. The visualized great vessels appear unremarkable. Pericardium is not appreciably thickened. Mediastinum/Nodes: Visualized thyroid appears normal. There is no appreciable thoracic adenopathy. Lungs/Pleura: There is mild bibasilar atelectasis. There is no parenchymal lung edema or consolidation. On axial slice 42 series 7, there is a 2 mm nodular opacity in the periphery of the superior segment of the right lower lobe. Upper Abdomen: There are small probable cysts in the liver. Visualized upper abdominal structures otherwise appear unremarkable. Musculoskeletal: There is degenerative change in the lower thoracic spine. There are no blastic or lytic bone lesions. Review of the MIP images confirms the above findings. IMPRESSION: No  demonstrable pulmonary embolus. No edema or consolidation. 2 mm nodular opacity in the superior segment right lower lobe. No follow-up needed if patient is low-risk. Non-contrast chest CT can be considered in 12 months if patient is high-risk. This recommendation follows the consensus statement: Guidelines for Management of Incidental Pulmonary Nodules Detected on CT Images: From the Fleischner Society 2017; Radiology 2017; 284:228-243. No evident adenopathy. Electronically Signed   By: Lowella Grip III M.D.   On: 11/26/2015 20:11    ASSESSMENT: 45 y.o. BRCA negative [place] woman status post right breast upper inner quadrant biopsy 03/25/2015 for a clinical T1BN0 invasive ductal carcinoma, grade 1, estrogen and progesterone receptor positive, HER-2 nonamplified, with an MIB-1 of 3%  (1) status post right lumpectomy and sentinel lymph node sampling 05/27/2015 for a pT1b pN0, stage IA invasive  ductal carcinoma, grade 1, repeat HER-2 again negative.  (2) Oncotype DX score of 17 predicts a risk of recurrence outside the breast within 10 years of 11% if the patient's only systemic therapy is tamoxifen for 5 years  (3) adjuvant radiation  completed 08/20/2015   (4) started tamoxifen 09/24/2015   (5) genetics testing 04/13/2015 through the Breast/Ovarian/Endometrial cancer genes. The Custom gene panel offered by [facility] includes sequencing and rearrangement analysis for the following 22 genes:  ATM, BARD1, BRCA1, BRCA2, BRIP1, CDH1, CHEK2, EPCAM, FANCC, MLH1, MSH2, MSH6, MUTYH, NBN, PALB2, PMS2, POLD1, PTEN, RAD51C, RAD51D, TP53, and XRCC2.    PLAN: [place] Clap and is tolerating the tamoxifen well and the plan will be to continue that a total of 5 years.  I don't have a simple explanation for the discomfort that took her to the emergency room. It could've been reflux, currently been related to her radiation treatments, or to her prior surgery. I did emphasize the difference between saying "I have chest  pain" and "I have chest wall pain".  She is appropriately concerned with the fact that tamoxifen can cause endometrial lining thickening, polyps, and in rare cases cancer. She has not had a period now for several months. We don't have a well established way to screen for this but she will bring the concern up with her gynecologist and in many cases annual or biannual helical ultrasound can be reassuring.  She will be seeing Dr Waymon Amato in February. She will have her mammogram in March or early April. She will return to see me in May.  She knows to call for any problems that may develop before the next visit here. Chauncey Cruel, MD   12/15/2015 7:44 PM Medical Oncology and Hematology [facility] [address] [place], [place] [address] Tel. [phone]    Fax. [phone]

## 2016-02-01 ENCOUNTER — Other Ambulatory Visit: Payer: Self-pay | Admitting: Obstetrics and Gynecology

## 2016-02-01 DIAGNOSIS — Z853 Personal history of malignant neoplasm of breast: Secondary | ICD-10-CM

## 2016-03-02 ENCOUNTER — Ambulatory Visit
Admission: RE | Admit: 2016-03-02 | Discharge: 2016-03-02 | Disposition: A | Discharge status: Home / Self Care | Payer: BLUE CROSS/BLUE SHIELD | Source: Ambulatory Visit | Attending: Obstetrics and Gynecology | Admitting: Obstetrics and Gynecology

## 2016-03-02 DIAGNOSIS — Z853 Personal history of malignant neoplasm of breast: Secondary | ICD-10-CM

## 2016-03-09 ENCOUNTER — Other Ambulatory Visit: Payer: Self-pay | Admitting: Obstetrics and Gynecology

## 2016-03-10 LAB — CYTOLOGY - PAP

## 2016-05-09 ENCOUNTER — Other Ambulatory Visit (HOSPITAL_BASED_OUTPATIENT_CLINIC_OR_DEPARTMENT_OTHER): Payer: BLUE CROSS/BLUE SHIELD

## 2016-05-09 ENCOUNTER — Other Ambulatory Visit: Payer: Self-pay | Admitting: Adult Health

## 2016-05-09 DIAGNOSIS — Z170 Estrogen receptor positive status [ER+]: Secondary | ICD-10-CM

## 2016-05-09 DIAGNOSIS — C50211 Malignant neoplasm of upper-inner quadrant of right female breast: Secondary | ICD-10-CM

## 2016-05-09 LAB — CBC WITH DIFFERENTIAL/PLATELET
BASO%: 0.8 % (ref 0.0–2.0)
BASOS ABS: 0 10*3/uL (ref 0.0–0.1)
EOS ABS: 0.1 10*3/uL (ref 0.0–0.5)
EOS%: 1.8 % (ref 0.0–7.0)
HCT: 38.9 % (ref 34.8–46.6)
HGB: 13 g/dL (ref 11.6–15.9)
LYMPH%: 25 % (ref 14.0–49.7)
MCH: 29.7 pg (ref 25.1–34.0)
MCHC: 33.5 g/dL (ref 31.5–36.0)
MCV: 88.7 fL (ref 79.5–101.0)
MONO#: 0.5 10*3/uL (ref 0.1–0.9)
MONO%: 11 % (ref 0.0–14.0)
NEUT#: 2.5 10*3/uL (ref 1.5–6.5)
NEUT%: 61.4 % (ref 38.4–76.8)
PLATELETS: 178 10*3/uL (ref 145–400)
RBC: 4.38 10*6/uL (ref 3.70–5.45)
RDW: 13.8 % (ref 11.2–14.5)
WBC: 4.1 10*3/uL (ref 3.9–10.3)
lymph#: 1 10*3/uL (ref 0.9–3.3)

## 2016-05-09 LAB — COMPREHENSIVE METABOLIC PANEL
ALBUMIN: 3.6 g/dL (ref 3.5–5.0)
ALK PHOS: 74 U/L (ref 40–150)
ALT: 10 U/L (ref 0–55)
ANION GAP: 7 meq/L (ref 3–11)
AST: 34 U/L (ref 5–34)
BILIRUBIN TOTAL: 0.93 mg/dL (ref 0.20–1.20)
BUN: 9.7 mg/dL (ref 7.0–26.0)
CO2: 27 meq/L (ref 22–29)
Calcium: 9.2 mg/dL (ref 8.4–10.4)
Chloride: 108 mEq/L (ref 98–109)
Creatinine: 0.8 mg/dL (ref 0.6–1.1)
EGFR: 86 mL/min/{1.73_m2} — AB (ref >90)
GLUCOSE: 88 mg/dL (ref 70–140)
Potassium: 4.2 mEq/L (ref 3.5–5.1)
SODIUM: 141 meq/L (ref 136–145)
TOTAL PROTEIN: 6.4 g/dL (ref 6.4–8.3)

## 2016-05-10 ENCOUNTER — Other Ambulatory Visit: Payer: BLUE CROSS/BLUE SHIELD

## 2016-05-17 ENCOUNTER — Ambulatory Visit (HOSPITAL_BASED_OUTPATIENT_CLINIC_OR_DEPARTMENT_OTHER): Payer: BLUE CROSS/BLUE SHIELD | Admitting: Oncology

## 2016-05-17 VITALS — BP 103/72 | HR 72 | Temp 98.2°F | Resp 18 | Ht 65.0 in | Wt 128.3 lb

## 2016-05-17 DIAGNOSIS — Z79810 Long term (current) use of selective estrogen receptor modulators (SERMs): Secondary | ICD-10-CM | POA: Diagnosis not present

## 2016-05-17 DIAGNOSIS — D367 Benign neoplasm of other specified sites: Secondary | ICD-10-CM | POA: Diagnosis not present

## 2016-05-17 DIAGNOSIS — Z170 Estrogen receptor positive status [ER+]: Secondary | ICD-10-CM | POA: Diagnosis not present

## 2016-05-17 DIAGNOSIS — C50211 Malignant neoplasm of upper-inner quadrant of right female breast: Secondary | ICD-10-CM

## 2016-05-17 NOTE — Progress Notes (Signed)
Renee Wright  [phone] Fax:(336) [phone]     ID: Renee Wright DOB: 02/14/1970  MR#: [id number]  OJJ#:[id number]  Patient Care Team: Ronita Hipps, MD as PCP - General (Family Medicine) Olga Millers, MD as PCP - OBGYN (Obstetrics and Gynecology) Titianna Loomis, Virgie Dad, MD as Consulting Physician (Oncology) Erroll Luna, MD as Consulting Physician (General Surgery) Eppie Gibson, MD as Attending Physician (Radiation Oncology) Sylvan Cheese, NP as Nurse Practitioner (Hematology and Oncology) PCP: Ronita Hipps, MD OTHER MD:  CHIEF COMPLAINT: Estrogen receptor positive invasive breast cancer   CURRENT TREATMENT: Tamoxifen   BREAST CANCER HISTORY:  From the original intake note:  Renee Wright had screening mammography 03/02/2015 showing a possible change in her right breast. On 03/15/2015 she underwent right diagnostic mammography with tomosynthesis and right breast ultrasonography at the [facility]. Breast density was category C. Mammography confirmed an irregular mass in the upper right breast and draining 0.6 cm, with associated microcalcifications. Ultrasound confirmed an irregular hypoechoic mass at the 12:30 o'clock axis measuring 0.6 cm. The right axilla was sonographically normal.  Biopsy of the right breast mass in question 03/25/2015 showed ([id number]) invasive ductal carcinoma, grade 1, estrogen receptor 90% positive, progesterone receptor 90% positive, both with strong staining intensity, with an MIB-1 of 3%, and HER-2 nonamplified, the signals ratio being 0.9 to and the number per cell 1.75.  Her subsequent history is as detailed below  INTERVAL HISTORY: Renee Wright returns today for follow-up of her estrogen receptor positive breast cancer. She continues on tamoxifen, with excellent tolerance. Hot flashes are rare and mild. Vaginal wetness or dryness are not issues. She was obtaining the drug at no cost but currently spanning  approximately $3 a month for it  REVIEW OF SYSTEMS: She walks for about 45 minutes about 3 times a week. A detailed review of systems today was entirely on remarkable except that she tells me she has a mole on her left anterior thigh that she wants me to look at.  PAST MEDICAL HISTORY: Past Medical History:  Diagnosis Date  . Breast calcification, right 01/2011  . Breast cancer ([place])   . Breast cancer of upper-inner quadrant of right female breast ([place]) 03/26/2015  . Breast cyst 01/13/2011   Right breast at 7:30 areolar edge   . Family history of breast cancer   . Family history of colon cancer   . Family history of melanoma   . Family history of uterine cancer   . IBS (irritable bowel syndrome)   . PONV (postoperative nausea and vomiting)     PAST SURGICAL HISTORY: Past Surgical History:  Procedure Laterality Date  . BREAST BIOPSY  10/1991  . BREAST BIOPSY  02/02/2011   Procedure: BREAST BIOPSY WITH NEEDLE LOCALIZATION;  Surgeon: Haywood Lasso, MD;  Location: [facility];  Service: General;  Laterality: Right;  removal of right breast calcifications  . BREAST LUMPECTOMY  01/17/11   benign- right breast (NCB)  . BREAST LUMPECTOMY WITH RADIOACTIVE SEED AND SENTINEL LYMPH NODE BIOPSY Right 05/27/2015   Procedure: BREAST LUMPECTOMY WITH RADIOACTIVE SEED AND SENTINEL LYMPH NODE BIOPSY;  Surgeon: Erroll Luna, MD;  Location: [facility];  Service: General;  Laterality: Right;  . CESAREAN SECTION  05/29/96, 03/30/00    FAMILY HISTORY Family History  Problem Relation Age of Onset  . Breast cancer Maternal Aunt 62  . Uterine cancer Mother 56  . Melanoma Maternal Grandmother 75    mets to bladder  .  Colon cancer Paternal Grandmother     dx over 9  The patient's parents are in their mid 28s as of March 2017. The patient's mother was diagnosed with a rare form of uterine cancer at age 56. She underwent hysterectomy and apparently was cured, with no systemic  treatment. The patient's mother had 2 sisters, one of them was diagnosed with breast cancer at the age of 78. In addition the patient's maternal grandmother was diagnosed with melanoma at age 65. On the paternal side there is a history of colon cancer in the paternal grandmother at age 74+. There is no history of ovarian cancer in the family  GYNECOLOGIC HISTORY:  No LMP recorded. Patient is not currently having periods (Reason: Other). Menarche age 64 first live birth age 68 the patient is [place] P2. She still having regular periods. She has been on oral contraceptives for 31 years. She was asked to discontinue these at the time of breast cancer diagnosis in March 2017.  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 Daughter Noah Delaine lives in [place] and is a Electronics engineer in business. Son Ovid Curd also lives at home. He is in high school.    ADVANCED DIRECTIVES: Not in place   HEALTH MAINTENANCE: Social History  Substance Use Topics  . Smoking status: Never Smoker  . Smokeless tobacco: Never Used  . Alcohol use Yes     Colonoscopy:  [id number]:[id number] 2017  Bone density:  Lipid panel:  No Known Allergies  Current Outpatient Prescriptions  Medication Sig Dispense Refill  . ibuprofen (ADVIL,MOTRIN) 800 MG tablet Take 1 tablet (800 mg total) by mouth every 8 (eight) hours as needed. 21 tablet 0  . tamoxifen (NOLVADEX) 20 MG tablet TAKE 1 TABLET (20 MG TOTAL) BY MOUTH EVERY EVENING  12   No current facility-administered medications for this visit.     OBJECTIVE: Middle-aged white woman In no acute distress Vitals:   05/17/16 1351  BP: 103/72  Pulse: 72  Resp: 18  Temp: 98.2 F (36.8 C)     Body mass index is 21.35 kg/m.    ECOG FS:0 - Asymptomatic  Sclerae unicteric, pupils round and equal Oropharynx clear and moist No cervical or supraclavicular adenopathy Lungs no rales or rhonchi Heart regular  rate and rhythm Abd soft, nontender, positive bowel sounds MSK no focal spinal tenderness, no upper extremity lymphedema Neuro: nonfocal, well oriented, appropriate affect Breasts: The right breast as undergone lumpectomy followed by radiation, with no evidence of local recurrence. The left breast is unremarkable. Both axillae are benign Skin: The lesion in the right anterior thigh that she is concerned about is imaged below.  Mole right anterior thigh 05/17/2016     LAB RESULTS:  CMP     Component Value Date/Time   NA 141 05/09/2016 0922   K 4.2 05/09/2016 0922   CL 106 11/26/2015 1721   CO2 27 05/09/2016 0922   GLUCOSE 88 05/09/2016 0922   BUN 9.7 05/09/2016 0922   CREATININE 0.8 05/09/2016 0922   CALCIUM 9.2 05/09/2016 0922   PROT 6.4 05/09/2016 0922   ALBUMIN 3.6 05/09/2016 0922   AST 34 05/09/2016 0922   ALT 10 05/09/2016 0922   ALKPHOS 74 05/09/2016 0922   BILITOT 0.93 05/09/2016 0922   GFRNONAA >60 11/26/2015 1721   GFRAA >60 11/26/2015 1721    [place] results found for: SPEP, UPEP  Lab Results  Component Value Date  WBC 4.1 05/09/2016   NEUTROABS 2.5 05/09/2016   HGB 13.0 05/09/2016   HCT 38.9 05/09/2016   MCV 88.7 05/09/2016   PLT 178 05/09/2016      Chemistry      Component Value Date/Time   NA 141 05/09/2016 0922   K 4.2 05/09/2016 0922   CL 106 11/26/2015 1721   CO2 27 05/09/2016 0922   BUN 9.7 05/09/2016 0922   CREATININE 0.8 05/09/2016 0922      Component Value Date/Time   CALCIUM 9.2 05/09/2016 0922   ALKPHOS 74 05/09/2016 0922   AST 34 05/09/2016 0922   ALT 10 05/09/2016 0922   BILITOT 0.93 05/09/2016 0922       No results found for: LABCA2  No components found for: LABCA125  No results for input(s): INR in the last 168 hours.  Urinalysis No results found for: COLORURINE, APPEARANCEUR, LABSPEC, PHURINE, GLUCOSEU, HGBUR, BILIRUBINUR, KETONESUR, PROTEINUR, UROBILINOGEN, NITRITE, LEUKOCYTESUR    ELIGIBLE FOR AVAILABLE RESEARCH  PROTOCOL: No  STUDIES: Bilateral diagnostic mammography with tomography at the [facility] 03/02/2016 found a breast density to be category C. There was no evidence of malignancy.  ASSESSMENT: 46 y.o. BRCA negative [place] woman status post right breast upper inner quadrant biopsy 03/25/2015 for a clinical T1BN0 invasive ductal carcinoma, grade 1, estrogen and progesterone receptor positive, HER-2 nonamplified, with an MIB-1 of 3%  (1) status post right lumpectomy and sentinel lymph node sampling 05/27/2015 for a pT1b pN0, stage IA invasive ductal carcinoma, grade 1, repeat HER-2 again negative.  (2) Oncotype DX score of 17 predicts a risk of recurrence outside the breast within 10 years of 11% if the patient's only systemic therapy is tamoxifen for 5 years  (3) adjuvant radiation  completed 08/20/2015   (4) started tamoxifen 09/24/2015   (5) genetics testing 04/13/2015 through the Breast/Ovarian/Endometrial Custom gene panel offered by [facility] found no deleterious mutations in ATM, BARD1, BRCA1, BRCA2, BRIP1, CDH1, CHEK2, EPCAM, FANCC, MLH1, MSH2, MSH6, MUTYH, NBN, PALB2, PMS2, POLD1, PTEN, RAD51C, RAD51D, TP53, and XRCC2.    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   (6) mole right anterior thigh, nonpigmented, likely malignant  PLAN: Renee Wright is now a little over a year out from definitive surgery for her breast cancer with no evidence of disease recurrence. This is favorable.  She is tolerating tamoxifen with practically no side effects. The plan is to continue that for a total of 5 years.  She wanted to know if she was menopausal since she has not had a period since April 2017. However tamoxifen can "hide" periods or make them subclinical. She would need an Vanc[place] and estradiol level to establish whether she is menopausal at this time or not. She understands that tamoxifen is not a contraceptive.  I offered to obtain those labs  today but she tells me she will get them through her primary care physician.  I am concerned regarding the lesion on her right anterior thigh. I urged her to get that looked at by dermatology as soon as possible. She feels she be able to arrange that this week.  Otherwise as far as breast cancer is concerned I will start seeing her on a once a year basis from this point. She knows to call for any other problems that may develop before her next visit. Chauncey Cruel, MD   05/17/2016 1:55 PM Medical Oncology and Hematology [facility] [place], [place]  [place] Tel. [phone]    Fax. [phone]

## 2016-09-11 ENCOUNTER — Other Ambulatory Visit: Payer: Self-pay | Admitting: Oncology

## 2016-09-15 ENCOUNTER — Encounter: Payer: Self-pay | Admitting: Genetic Counselor

## 2017-01-19 ENCOUNTER — Other Ambulatory Visit: Payer: Self-pay | Admitting: Obstetrics and Gynecology

## 2017-01-19 DIAGNOSIS — Z9889 Other specified postprocedural states: Secondary | ICD-10-CM

## 2017-03-12 ENCOUNTER — Ambulatory Visit
Admission: RE | Admit: 2017-03-12 | Discharge: 2017-03-12 | Disposition: A | Discharge status: Home / Self Care | Payer: BLUE CROSS/BLUE SHIELD | Source: Ambulatory Visit | Attending: Obstetrics and Gynecology | Admitting: Obstetrics and Gynecology

## 2017-03-12 DIAGNOSIS — Z9889 Other specified postprocedural states: Secondary | ICD-10-CM

## 2017-03-12 HISTORY — DX: Personal history of irradiation: Z92.3

## 2017-04-27 ENCOUNTER — Telehealth: Payer: Self-pay | Admitting: Oncology

## 2017-04-27 NOTE — Telephone Encounter (Signed)
Called pt re moving it due to GM PAL - left voicemail for pt re appts.

## 2017-05-14 ENCOUNTER — Inpatient Hospital Stay: Payer: BLUE CROSS/BLUE SHIELD | Attending: Oncology

## 2017-05-14 ENCOUNTER — Inpatient Hospital Stay (HOSPITAL_BASED_OUTPATIENT_CLINIC_OR_DEPARTMENT_OTHER): Payer: BLUE CROSS/BLUE SHIELD | Admitting: Oncology

## 2017-05-14 ENCOUNTER — Telehealth: Payer: Self-pay | Admitting: Oncology

## 2017-05-14 VITALS — BP 106/63 | HR 62 | Temp 98.7°F | Resp 18 | Ht 65.0 in | Wt 129.7 lb

## 2017-05-14 DIAGNOSIS — Z170 Estrogen receptor positive status [ER+]: Secondary | ICD-10-CM

## 2017-05-14 DIAGNOSIS — N941 Unspecified dyspareunia: Secondary | ICD-10-CM

## 2017-05-14 DIAGNOSIS — C50211 Malignant neoplasm of upper-inner quadrant of right female breast: Secondary | ICD-10-CM | POA: Diagnosis not present

## 2017-05-14 DIAGNOSIS — Z79810 Long term (current) use of selective estrogen receptor modulators (SERMs): Secondary | ICD-10-CM | POA: Diagnosis not present

## 2017-05-14 LAB — CBC WITH DIFFERENTIAL/PLATELET
BASOS ABS: 0 10*3/uL (ref 0.0–0.1)
Basophils Relative: 0 %
EOS ABS: 0.1 10*3/uL (ref 0.0–0.5)
Eosinophils Relative: 1 %
HCT: 38.6 % (ref 34.8–46.6)
HEMOGLOBIN: 12.7 g/dL (ref 11.6–15.9)
LYMPHS ABS: 1.5 10*3/uL (ref 0.9–3.3)
LYMPHS PCT: 28 %
MCH: 29.5 pg (ref 25.1–34.0)
MCHC: 32.9 g/dL (ref 31.5–36.0)
MCV: 89.6 fL (ref 79.5–101.0)
Monocytes Absolute: 0.5 10*3/uL (ref 0.1–0.9)
Monocytes Relative: 10 %
NEUTROS PCT: 61 %
Neutro Abs: 3.2 10*3/uL (ref 1.5–6.5)
PLATELETS: 190 10*3/uL (ref 145–400)
RBC: 4.31 MIL/uL (ref 3.70–5.45)
RDW: 13.4 % (ref 11.2–14.5)
WBC: 5.3 10*3/uL (ref 3.9–10.3)

## 2017-05-14 LAB — COMPREHENSIVE METABOLIC PANEL
ALT: 8 U/L (ref 0–55)
AST: 33 U/L (ref 5–34)
Albumin: 3.8 g/dL (ref 3.5–5.0)
Alkaline Phosphatase: 54 U/L (ref 40–150)
Anion gap: 4 (ref 3–11)
BUN: 8 mg/dL (ref 7–26)
CHLORIDE: 108 mmol/L (ref 98–109)
CO2: 28 mmol/L (ref 22–29)
CREATININE: 0.82 mg/dL (ref 0.60–1.10)
Calcium: 9.2 mg/dL (ref 8.4–10.4)
Glucose, Bld: 98 mg/dL (ref 70–140)
POTASSIUM: 3.6 mmol/L (ref 3.5–5.1)
Sodium: 140 mmol/L (ref 136–145)
TOTAL PROTEIN: 6.7 g/dL (ref 6.4–8.3)
Total Bilirubin: 0.5 mg/dL (ref 0.2–1.2)

## 2017-05-14 NOTE — Progress Notes (Signed)
Renee Wright  [phone] [phone]) [phone]     ID: Renee Wright DOB: [birth year]  MR#: [id number]  HQR#:[id number]  Patient Care Team: Ronita Hipps, MD as PCP - General (Family Medicine) Olga Millers, MD as PCP - OBGYN (Obstetrics and Gynecology) Rynn Markiewicz, Virgie Dad, MD as Consulting Physician (Oncology) Erroll Luna, MD as Consulting Physician (General Surgery) Eppie Gibson, MD as Attending Physician (Radiation Oncology) Sylvan Cheese, NP as Nurse Practitioner (Hematology and Oncology) PCP: Ronita Hipps, MD OTHER MD:  CHIEF COMPLAINT: Estrogen receptor positive invasive breast cancer   CURRENT TREATMENT: Tamoxifen   BREAST CANCER HISTORY:  From the original intake note:  Renee Wright had screening mammography 03/02/2015 showing a possible change in her right breast. On 03/15/2015 she underwent right diagnostic mammography with tomosynthesis and right breast ultrasonography at the [facility]. Breast density was category C. Mammography confirmed an irregular mass in the upper right breast and draining 0.6 cm, with associated microcalcifications. Ultrasound confirmed an irregular hypoechoic mass at the 12:30 o'clock axis measuring 0.6 cm. The right axilla was sonographically normal.  Biopsy of the right breast mass in question 03/25/2015 showed ([id number]) invasive ductal carcinoma, grade 1, estrogen receptor 90% positive, progesterone receptor 90% positive, both with strong staining intensity, with an MIB-1 of 3%, and HER-2 nonamplified, the signals ratio being 0.9 to and the number per cell 1.75.  Her subsequent history is as detailed below  INTERVAL HISTORY: Renee Wright returns today for follow-up of her estrogen receptor positive breast cancer. She continues on tamoxifen, with good tolerance. She has occasional rare hot flashes. She denies issues with increased vaginal discharge.   Since her last visit, she underwent diagnostic  bilateral mammography with CAD and tomography on 03/12/2017 at [facility] showing: breast density category C. There was no evidence of malignancy.   Her main problem is despair union and this was discussed extensively today  Also at the last visit we noted a lesion on her right thigh.  She tells me this was biopsied by Dr. Michele Mcalpine, dermatology in [place], and it was benign.  It has completely resolved since that time.   REVIEW OF SYSTEMS: Renee Wright reports that she is still working her same job. Her oldest daughter is studying business at [employer]. Her youngest daughter is a Paramedic in high school. Renee Wright reports that she sits at her computer most the day and she takes less than 5,000 steps per day. She may walk for 30 minutes twice per week. She maintains her weight. She saw Dr. Michele Mcalpine under [facility] who biopsied the mole on her right leg. According to the patient, the pathology showed no evidence of malignancy. She is also experiencing some dyspareunia. She is also following up with her gynecologist. She denies unusual headaches, visual changes, nausea, vomiting, or dizziness. There has been no unusual cough, phlegm production, or pleurisy. This been no change in bowel or bladder habits. She denies unexplained fatigue or unexplained weight loss, bleeding, rash, or fever. A detailed review of systems was otherwise stable.    PAST MEDICAL HISTORY: Past Medical History:  Diagnosis Date  . Breast calcification, right 01/2011  . Breast cancer ([place])   . Breast cancer of upper-inner quadrant of right female breast ([place]) 03/26/2015  . Breast cyst 01/13/2011   Right breast at 7:30 areolar edge   . Family history of breast cancer   . Family history of colon cancer   . Family history of melanoma   . Family history  of uterine cancer   . IBS (irritable bowel syndrome)   . Personal history of radiation therapy 2017  . PONV (postoperative nausea and vomiting)     PAST SURGICAL  HISTORY: Past Surgical History:  Procedure Laterality Date  . BREAST BIOPSY  10/1991  . BREAST BIOPSY  02/02/2011  . BREAST BIOPSY Right 03/20/2015  . BREAST EXCISIONAL BIOPSY Right 02/02/2011  . BREAST LUMPECTOMY Right 05/27/2015  . BREAST LUMPECTOMY WITH RADIOACTIVE SEED AND SENTINEL LYMPH NODE BIOPSY Right 05/27/2015   Procedure: BREAST LUMPECTOMY WITH RADIOACTIVE SEED AND SENTINEL LYMPH NODE BIOPSY;  Surgeon: Erroll Luna, MD;  Location: [facility];  Service: General;  Laterality: Right;  . CESAREAN SECTION  05/29/96, 03/30/00    FAMILY HISTORY Family History  Problem Relation Age of Onset  . Breast cancer Maternal Aunt 62  . Uterine cancer Mother 37  . Melanoma Maternal Grandmother 75       mets to bladder  . Colon cancer Paternal Grandmother        dx over 93  The patient's parents are in their mid 69s as of March 2017. The patient's mother was diagnosed with a rare form of uterine cancer at age 90. She underwent hysterectomy and apparently was cured, with no systemic treatment. The patient's mother had 2 sisters, one of them was diagnosed with breast cancer at the age of 53. In addition the patient's maternal grandmother was diagnosed with melanoma at age 49. On the paternal side there is a history of colon cancer in the paternal grandmother at age 6+. There is no history of ovarian cancer in the family  GYNECOLOGIC HISTORY:  No LMP recorded. (Menstrual status: Other). Menarche age 38 first live birth age 6 the patient is Renee Wright P2. She still having regular periods. She has been on oral contraceptives for 31 years. She was asked to discontinue these at the time of breast cancer diagnosis in March 2017.  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 Her husband Renee Wright is self-employed in Theme park manager. Daughter Renee Wright lives in [place] and is a Electronics engineer in business. Son Renee Wright also lives at home. He is in high school.    ADVANCED  DIRECTIVES: Not in place   HEALTH MAINTENANCE: Social History   Tobacco Use  . Smoking status: Never Smoker  . Smokeless tobacco: Never Used  Substance Use Topics  . Alcohol use: Yes  . Drug use: No     Colonoscopy:  HUD:[id number] 2017  Bone density:  Lipid panel:  No Known Allergies  Current Outpatient Medications  Medication Sig Dispense Refill  . ibuprofen (ADVIL,MOTRIN) 800 MG tablet Take 1 tablet (800 mg total) by mouth every 8 (eight) hours as needed. 21 tablet 0  . tamoxifen (NOLVADEX) 20 MG tablet TAKE 1 TABLET (20 MG TOTAL) BY MOUTH EVERY EVENING  12   No current facility-administered medications for this visit.     OBJECTIVE: Middle-aged white woman who appears well Vitals:   05/14/17 1351  BP: 106/63  Pulse: 62  Resp: 18  Temp: 98.7 F (37.1 C)  SpO2: 100%     Body mass index is 21.58 kg/m.    ECOG FS:0 - Asymptomatic  Sclerae unicteric, EOMs intact Oropharynx clear and moist No cervical or supraclavicular adenopathy Lungs no rales or rhonchi Heart regular rate and rhythm Abd soft, nontender, positive bowel sounds MSK no focal spinal tenderness, no upper extremity lymphedema Neuro: nonfocal, well oriented, appropriate affect Breasts: Right breast  is status post lumpectomy and radiation.  There is no evidence of local recurrence.  The left breast is benign.  Both axillae are benign.  LAB RESULTS:  CMP     Component Value Date/Time   NA 141 05/09/2016 0922   K 4.2 05/09/2016 0922   CL 106 11/26/2015 1721   CO2 27 05/09/2016 0922   GLUCOSE 88 05/09/2016 0922   BUN 9.7 05/09/2016 0922   CREATININE 0.8 05/09/2016 0922   CALCIUM 9.2 05/09/2016 0922   PROT 6.4 05/09/2016 0922   ALBUMIN 3.6 05/09/2016 0922   AST 34 05/09/2016 0922   ALT 10 05/09/2016 0922   ALKPHOS 74 05/09/2016 0922   BILITOT 0.93 05/09/2016 0922   GFRNONAA >60 11/26/2015 1721   GFRAA >60 11/26/2015 1721    INo results found for: SPEP, UPEP  Lab Results  Component Value  Date   WBC 5.3 05/14/2017   NEUTROABS 3.2 05/14/2017   HGB 12.7 05/14/2017   HCT 38.6 05/14/2017   MCV 89.6 05/14/2017   PLT 190 05/14/2017      Chemistry      Component Value Date/Time   NA 141 05/09/2016 0922   K 4.2 05/09/2016 0922   CL 106 11/26/2015 1721   CO2 27 05/09/2016 0922   BUN 9.7 05/09/2016 0922   CREATININE 0.8 05/09/2016 0922      Component Value Date/Time   CALCIUM 9.2 05/09/2016 0922   ALKPHOS 74 05/09/2016 0922   AST 34 05/09/2016 0922   ALT 10 05/09/2016 0922   BILITOT 0.93 05/09/2016 0922       No results found for: LABCA2  No components found for: LABCA125  No results for input(s): INR in the last 168 hours.  Urinalysis No results found for: COLORURINE, APPEARANCEUR, LABSPEC, PHURINE, GLUCOSEU, HGBUR, BILIRUBINUR, KETONESUR, PROTEINUR, UROBILINOGEN, NITRITE, LEUKOCYTESUR    ELIGIBLE FOR AVAILABLE RESEARCH PROTOCOL: No  STUDIES: Since her last visit, she underwent diagnostic bilateral mammography with CAD and tomography on 03/12/2017 at [facility] showing: breast density category C. There was no evidence of malignancy.  ASSESSMENT: 47 y.o. BRCA negative [place] woman status post right breast upper inner quadrant biopsy 03/25/2015 for a clinical T1BN0 invasive ductal carcinoma, grade 1, estrogen and progesterone receptor positive, HER-2 nonamplified, with an MIB-1 of 3%  (1) status post right lumpectomy and sentinel lymph node sampling 05/27/2015 for a pT1b pN0, stage IA invasive ductal carcinoma, grade 1, repeat HER-2 again negative.  (2) Oncotype DX score of 17 predicts a risk of recurrence outside the breast within 10 years of 11% if the patient's only systemic therapy is tamoxifen for 5 years  (3) adjuvant radiation  completed 08/20/2015   (4) started tamoxifen 09/24/2015   (5) genetics testing 04/13/2015 through the Breast/Ovarian/Endometrial Custom gene panel offered by [facility] found no deleterious mutations in ATM, BARD1, BRCA1,  BRCA2, BRIP1, CDH1, CHEK2, EPCAM, FANCC, MLH1, MSH2, MSH6, MUTYH, NBN, PALB2, PMS2, POLD1, PTEN, RAD51C, RAD51D, TP53, and XRCC2.    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   (6) mole right anterior thigh, nonpigmented, likely malignant  (a) benign biopsy 2018, with resolution PLAN: Allisonnow 2 years out from definitive surgery for breast cancer with no evidence of disease recurrence.  This is very favorable.  She continues on tamoxifen, with excellent tolerance.  The big problem she is having is dyspareunia.  This of course is not caused by tamoxifen itself but by menopause.  Tamoxifen if anything helps  by producing a little bit of the vaginal discharge although in Cheyann's case this has not helped.  Dr. Ouida Sills suggested she consider prasterone (a form of DHEA).  Other options of course include estrogen vaginal creams, estrogen vaginal suppositories, Estring, and the [employer].  This is all of course separate from and in addition to optimal exercise massage and lubrication techniques  We discussed the fact that tamoxifen blocks the estrogen receptor and breast cancer cells and therefore even though she will absorb some estrogen (and in the case of prasterone, some testosterone as well) this is not believed to affect breast cancer risk while on tamoxifen.  Of course it would be contraindicated with aromatase inhibitors.  There are of course the other risks associated with estrogens including thickening on the endometrial lining, and blood clots, which are already an issue with tamoxifen alone.  We also have a program called "pelvic health" where she can learn more about lubricants, massage, and exercise techniques  With all this information she could not quite make up her mind what to do but wants to do more research and I recommend that she also discussed all this with Dr. Ouida Sills.  She will let us know if she wants [place] to become  involved further in this issue  Otherwise she will see me again in 1 year.  She knows to call for any problems that may develop before her next visit.   Callin Ashe, Virgie Dad, MD  05/14/17 2:16 PM Medical Oncology and Hematology [facility] [address] [place], [place] [address] Tel. [phone]    Fax. [phone]  This document serves as a record of services personally performed by Lurline Del, MD. It was created on his behalf by Sheron Nightingale, a trained medical scribe. The creation of this record is based on the scribe's personal observations and the provider's statements to them.   I have reviewed the above documentation for accuracy and completeness, and I agree with the above.

## 2017-05-14 NOTE — Telephone Encounter (Signed)
Patient declined avs and calendar. Will receive update in mychart

## 2017-05-15 LAB — FOLLICLE STIMULATING HORMONE: FSH: 37 m[IU]/mL

## 2017-05-17 ENCOUNTER — Ambulatory Visit: Payer: BLUE CROSS/BLUE SHIELD | Admitting: Oncology

## 2017-05-17 ENCOUNTER — Other Ambulatory Visit: Payer: BLUE CROSS/BLUE SHIELD

## 2017-05-17 LAB — ESTRADIOL, ULTRA SENS: Estradiol, Sensitive: 3.1 pg/mL

## 2017-06-01 ENCOUNTER — Ambulatory Visit: Payer: BLUE CROSS/BLUE SHIELD | Admitting: Oncology

## 2017-06-01 ENCOUNTER — Other Ambulatory Visit: Payer: BLUE CROSS/BLUE SHIELD

## 2017-09-05 ENCOUNTER — Other Ambulatory Visit: Payer: Self-pay | Admitting: Oncology

## 2018-01-29 ENCOUNTER — Other Ambulatory Visit: Payer: Self-pay | Admitting: Obstetrics and Gynecology

## 2018-01-29 DIAGNOSIS — Z853 Personal history of malignant neoplasm of breast: Secondary | ICD-10-CM

## 2018-04-09 ENCOUNTER — Inpatient Hospital Stay: Admission: RE | Admit: 2018-04-09 | Payer: Self-pay | Source: Ambulatory Visit

## 2018-05-02 ENCOUNTER — Other Ambulatory Visit: Payer: Self-pay

## 2018-05-02 ENCOUNTER — Ambulatory Visit
Admission: RE | Admit: 2018-05-02 | Discharge: 2018-05-02 | Disposition: A | Discharge status: Home / Self Care | Payer: BLUE CROSS/BLUE SHIELD | Source: Ambulatory Visit | Attending: Obstetrics and Gynecology | Admitting: Obstetrics and Gynecology

## 2018-05-02 DIAGNOSIS — Z853 Personal history of malignant neoplasm of breast: Secondary | ICD-10-CM

## 2018-05-02 IMAGING — MG DIGITAL DIAGNOSTIC BILATERAL MAMMOGRAM WITH TOMO AND CAD
9 series · 9 of 25 positions shown · non-contrast
Comparison: Previous exam(s).

CLINICAL DATA: 46-year-old patient presents for routine annual
exam. History of right breast cancer diagnosed 2271.

EXAM:
DIGITAL DIAGNOSTIC BILATERAL MAMMOGRAM WITH CAD AND TOMO

[R MLO]
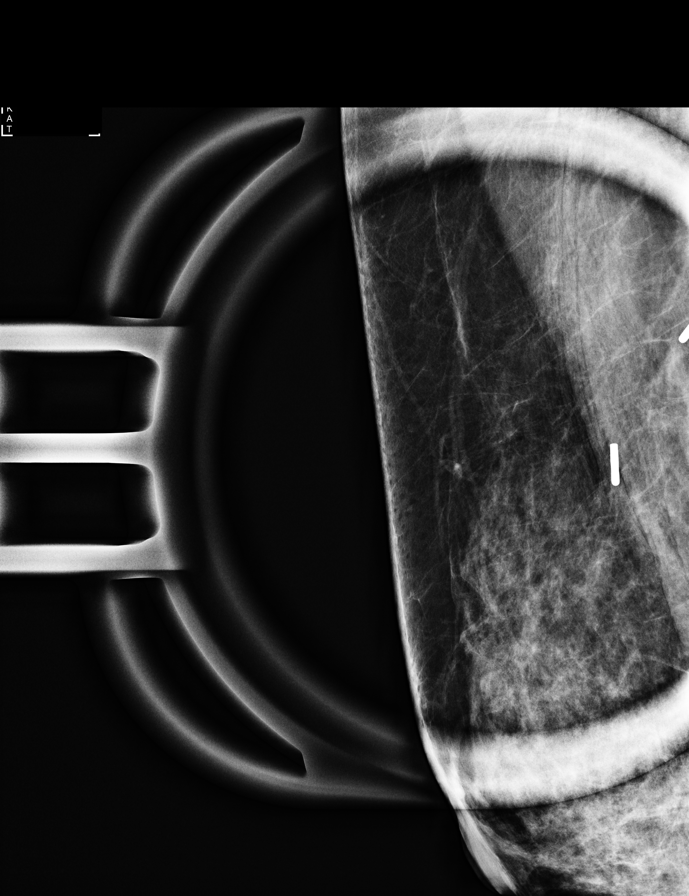

[L MLO synth-2D]
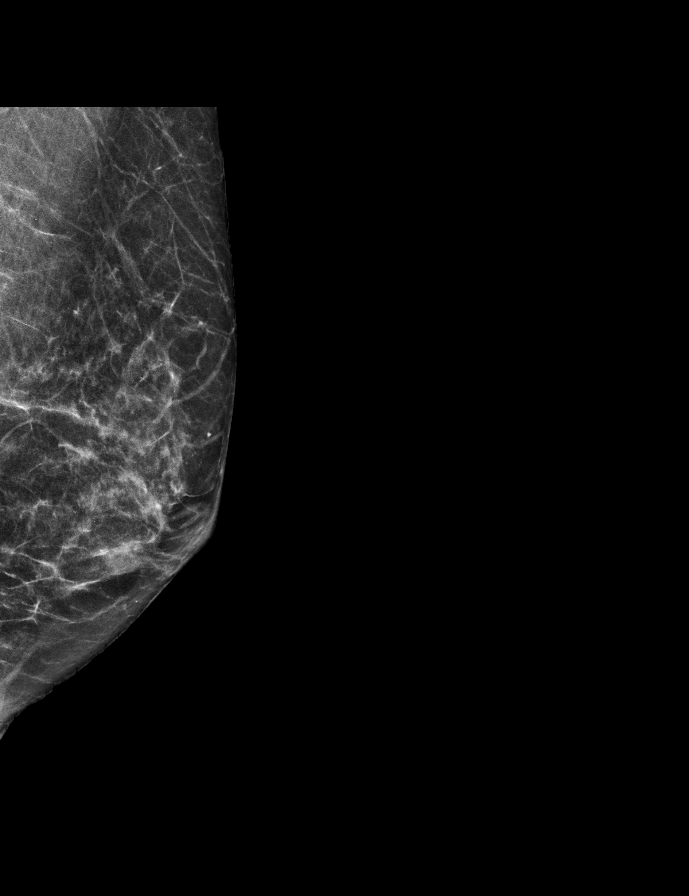

[R CC synth-2D]
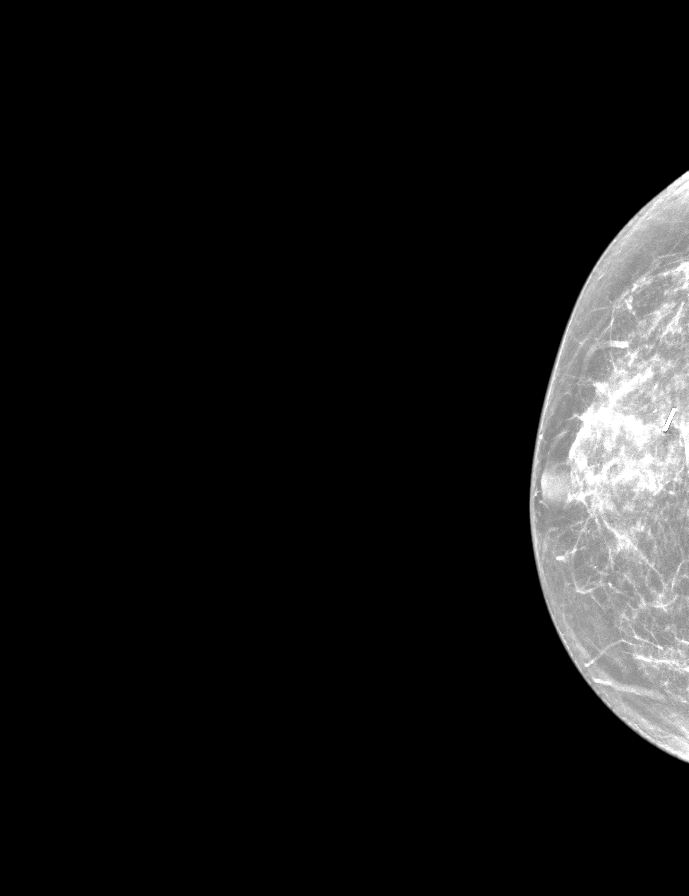

[R MLO synth-2D]
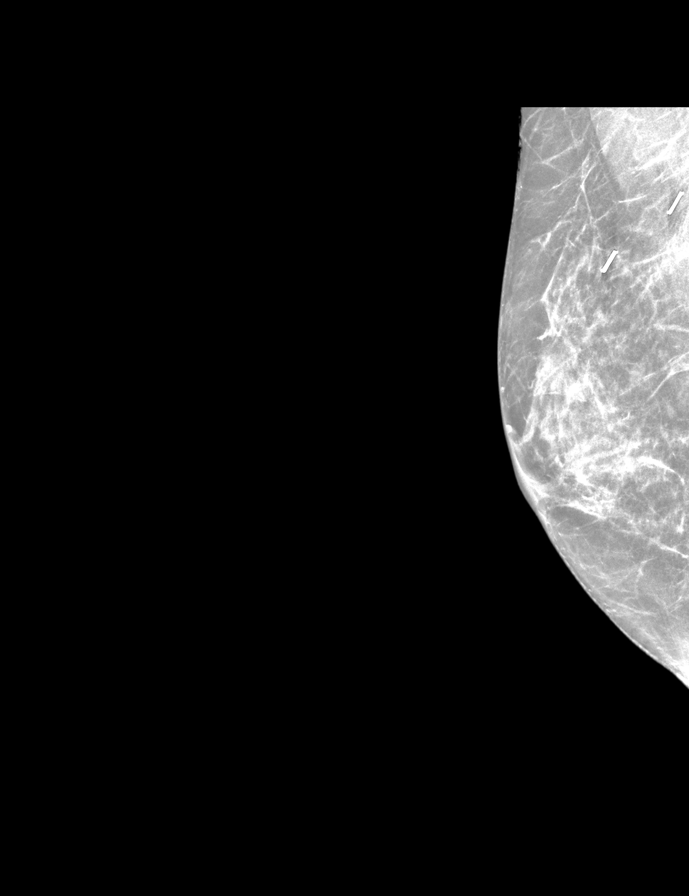

[L CC synth-2D]
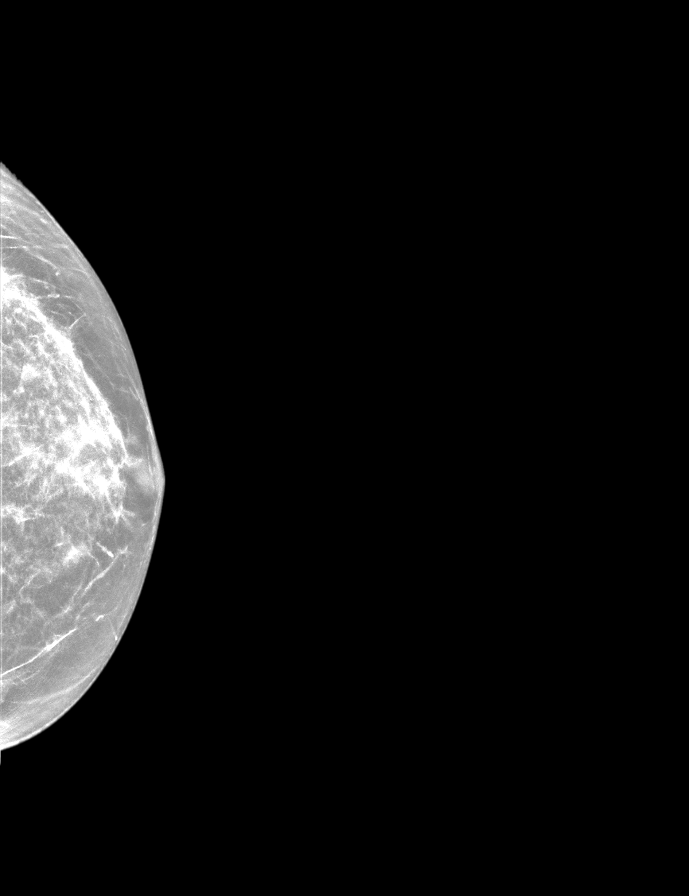

[R MLO tomo · tomo slice 24/47.0]
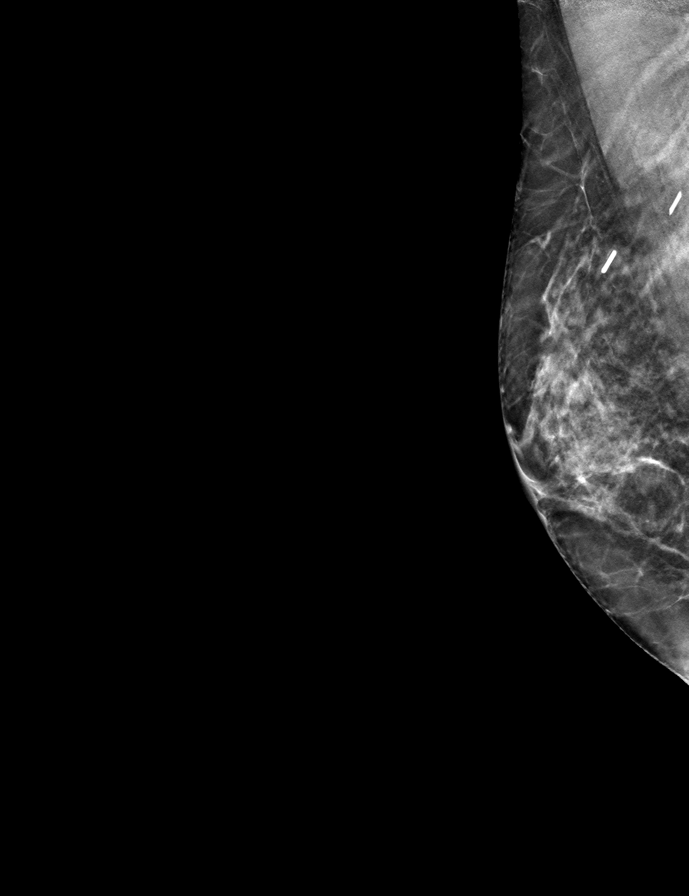

[L MLO tomo · tomo slice 23/45.0]
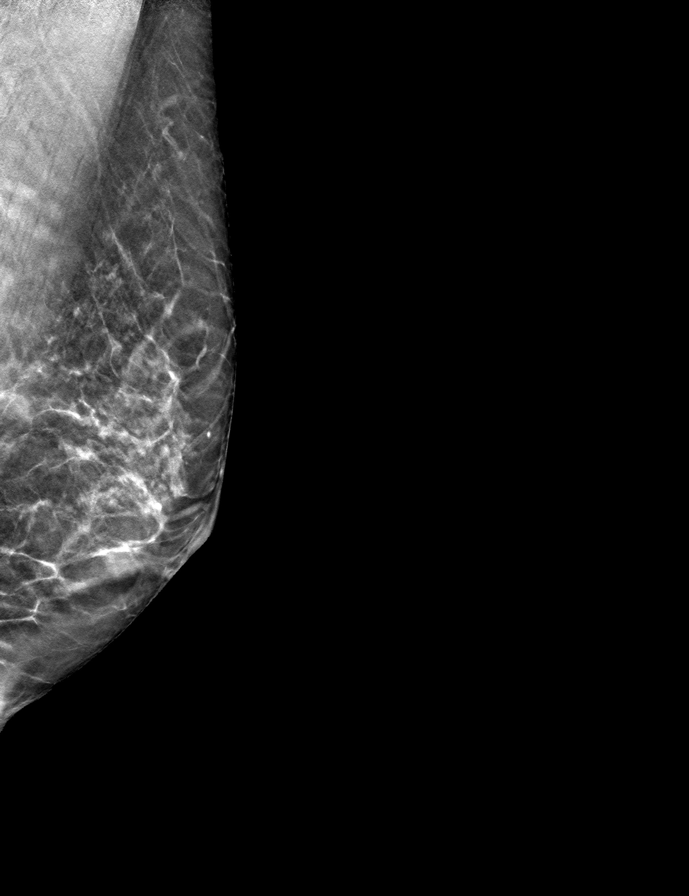

[R CC tomo · tomo slice 25/49.0]
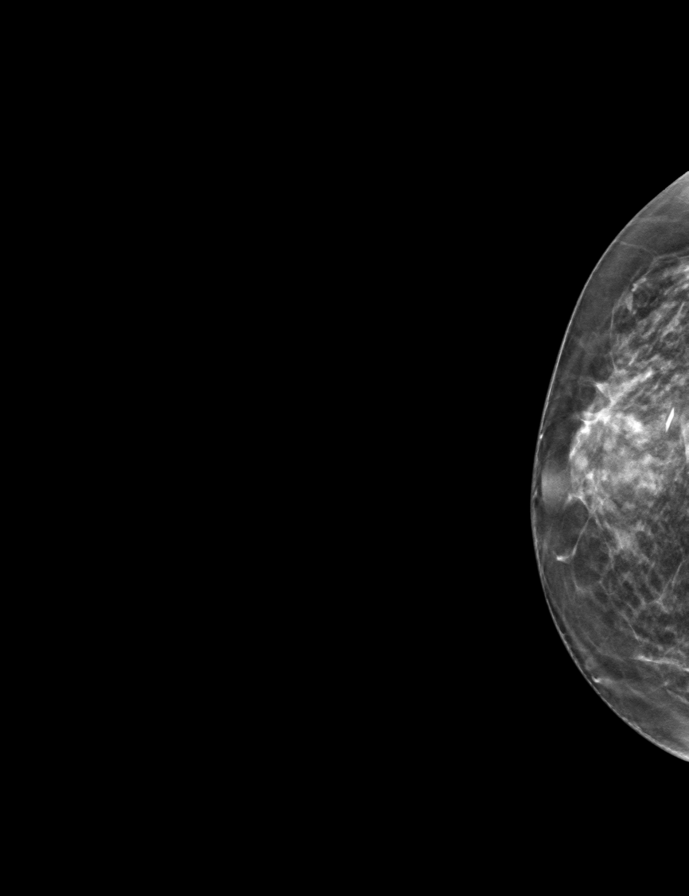

[L CC tomo · tomo slice 23/46.0]
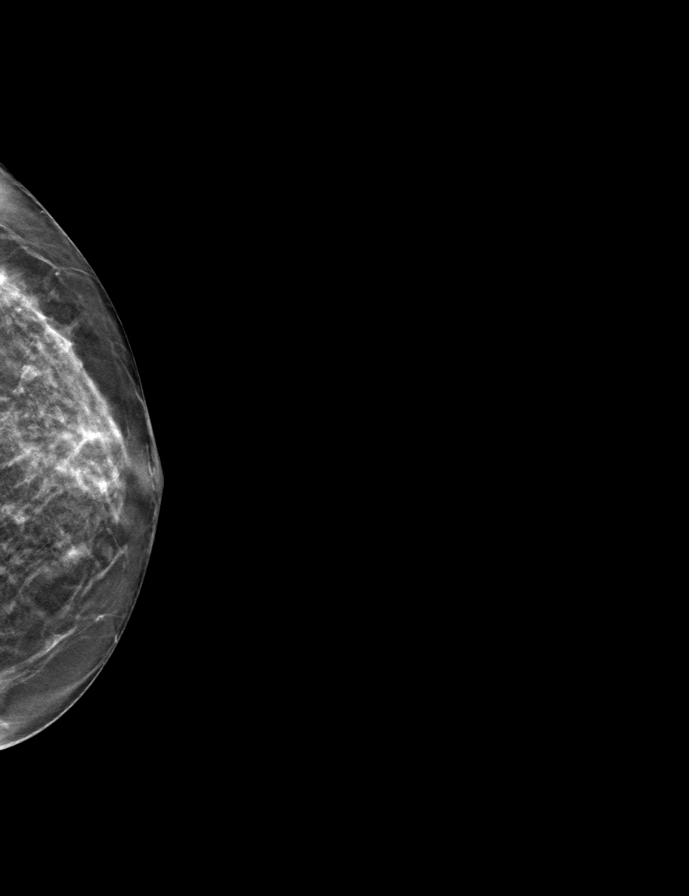

[9 of 25 positions shown; findings below may reference images not displayed]

ACR Breast Density Category c: The breast tissue is heterogeneously
dense, which may obscure small masses.
FINDINGS: Lumpectomy changes and surgical clips are present in the superior
right breast. No mass, nonsurgical distortion, or suspicious
microcalcification is identified in either breast to suggest
malignancy.

Mammographic images were processed with CAD.
IMPRESSION: No evidence of malignancy in either breast. Lumpectomy changes on
the right.

RECOMMENDATION:
Diagnostic mammogram is suggested in 1 year. (Code:[id number])

I have discussed the findings and recommendations with the patient.
Results were also provided in writing at the conclusion of the
visit. If applicable, a reminder letter will be sent to the patient
regarding the next appointment.

BI-RADS CATEGORY  2: Benign.

## 2018-05-14 ENCOUNTER — Encounter: Payer: Self-pay | Admitting: Oncology

## 2018-05-16 ENCOUNTER — Other Ambulatory Visit: Payer: Self-pay | Admitting: Oncology

## 2018-05-16 ENCOUNTER — Telehealth: Payer: Self-pay | Admitting: Oncology

## 2018-05-16 NOTE — Telephone Encounter (Signed)
Called patient regarding upcoming [facility] appointment, patient would like to cancel the appointment and reschedule when ready.   Message to provider.

## 2018-05-20 ENCOUNTER — Ambulatory Visit: Payer: BLUE CROSS/BLUE SHIELD | Admitting: Oncology

## 2018-05-20 ENCOUNTER — Other Ambulatory Visit: Payer: BLUE CROSS/BLUE SHIELD

## 2018-08-27 ENCOUNTER — Telehealth: Payer: Self-pay | Admitting: Oncology

## 2018-08-27 NOTE — Telephone Encounter (Signed)
Unable to reach pt per 8/17 sch message - left message with appt date and time

## 2018-09-11 NOTE — Progress Notes (Signed)
[place]  [phone]) [phone] Fax:(336) [phone]     ID: CINDRA AUSTAD DOB: 1970-05-29  MR#: [id number]  JSE#:[id number]  Patient Care Team: Ronita Hipps, MD as PCP - General (Family Medicine) Olga Millers, MD as PCP - OBGYN (Obstetrics and Gynecology) Jams Trickett, Virgie Dad, MD as Consulting Physician (Oncology) Erroll Luna, MD as Consulting Physician (General Surgery) Eppie Gibson, MD as Attending Physician (Radiation Oncology) OTHER MD:  CHIEF COMPLAINT: Estrogen receptor positive invasive breast cancer  CURRENT TREATMENT: Tamoxifen   INTERVAL HISTORY: Neeti returns today for follow-up of her estrogen receptor positive breast cancer.   She continues on tamoxifen, with good tolerance.  Hot flashes are not an issue and she still has the vaginal dryness problem, not vaginal wetness which tamoxifen can cause.  Since her last visit, she underwent bilateral diagnostic mammography with tomography at [facility] on 05/02/2018 showing: breast density category C; no evidence of malignancy in either breast.   REVIEW OF SYSTEMS: Deya had a cough and fever in early March.  She had the coronavirus test and was negative.  She and her family are being very careful and pretty much staying home.  She exercises by doing videos.  She has not gone back to the gym yet.  A detailed review of systems today was otherwise stable.   BREAST CANCER HISTORY:  From the original intake note:  Christy had screening mammography 03/02/2015 showing a possible change in her right breast. On 03/15/2015 she underwent right diagnostic mammography with tomosynthesis and [facility]. Breast density was category C. Mammography confirmed an irregular mass in the upper right breast and draining 0.6 cm, with associated microcalcifications. Ultrasound confirmed an irregular hypoechoic mass at the 12:30 o'clock axis measuring 0.6 cm. The right  axilla was sonographically normal.  Biopsy of the right breast mass in question 03/25/2015 showed ([id number]) invasive ductal carcinoma, grade 1, estrogen receptor 90% positive, progesterone receptor 90% positive, both with strong staining intensity, with an MIB-1 of 3%, and HER-2 nonamplified, the signals ratio being 0.9 to and the number per cell 1.75.  Her subsequent history is as detailed below   PAST MEDICAL HISTORY: Past Medical History:  Diagnosis Date  . Breast calcification, right 01/2011  . Breast cancer ([place])   . Breast cancer of upper-inner quadrant of right female breast ([place]) 03/26/2015  . Breast cyst 01/13/2011   Right breast at 7:30 areolar edge   . Family history of breast cancer   . Family history of colon cancer   . Family history of melanoma   . Family history of uterine cancer   . IBS (irritable bowel syndrome)   . Personal history of radiation therapy 2017  . PONV (postoperative nausea and vomiting)      PAST SURGICAL HISTORY: Past Surgical History:  Procedure Laterality Date  . BREAST BIOPSY  10/1991  . BREAST BIOPSY  02/02/2011  . BREAST BIOPSY Right 03/20/2015  . BREAST EXCISIONAL BIOPSY Right 02/02/2011  . BREAST LUMPECTOMY Right 05/27/2015  . BREAST LUMPECTOMY WITH RADIOACTIVE SEED AND SENTINEL LYMPH NODE BIOPSY Right 05/27/2015   Procedure: BREAST LUMPECTOMY WITH RADIOACTIVE SEED AND SENTINEL LYMPH NODE BIOPSY;  Surgeon: Erroll Luna, MD;  Location: [facility];  Service: General;  Laterality: Right;  . CESAREAN SECTION  05/29/96, 03/30/00    FAMILY HISTORY Family History  Problem Relation Age of Onset  . Breast cancer Maternal Aunt 62  . Uterine cancer Mother 50  .  Melanoma Maternal Grandmother 75       mets to bladder  . Colon cancer Paternal Grandmother        dx over 34  The patient's parents are in their mid 50s as of March 2017. The patient's mother was diagnosed with a rare form of uterine cancer at age 50. She underwent  hysterectomy and apparently was cured, with no systemic treatment. The patient's mother had 2 sisters, one of them was diagnosed with breast cancer at the age of 50. In addition the patient's maternal grandmother was diagnosed with melanoma at age 17. On the paternal side there is a history of colon cancer in the paternal grandmother at age 50+ There is no history of ovarian cancer in the family   GYNECOLOGIC HISTORY:  No LMP recorded. (Menstrual status: Other). Menarche age 73 first live birth age 59 the patient is [place] P2. She still having regular periods. She has been on oral contraceptives for 31 years. She was asked to discontinue these at the time of breast cancer diagnosis in March 2017.   SOCIAL HISTORY: (Updated September 2020).  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 Her husband J is self-employed in Theme park manager. Daughter Noah Delaine is going to [employer] but also staying home part of the time during the pandemic.daughter Ovid Curd also lives at home.  She is in high school.    ADVANCED DIRECTIVES: Not in place   HEALTH MAINTENANCE: Social History   Tobacco Use  . Smoking status: Never Smoker  . Smokeless tobacco: Never Used  Substance Use Topics  . Alcohol use: Yes  . Drug use: No     Colonoscopy: Due  AJO:INOMVEHM 2017  Bone density:  Lipid panel:  No Known Allergies  Current Outpatient Medications  Medication Sig Dispense Refill  . estradiol (ESTRACE VAGINAL) 0.1 MG/GM vaginal cream Place 1 Applicatorful vaginally at bedtime. 42.5 g 12  . ibuprofen (ADVIL,MOTRIN) 800 MG tablet Take 1 tablet (800 mg total) by mouth every 8 (eight) hours as needed. 21 tablet 0  . tamoxifen (NOLVADEX) 20 MG tablet Take 1 tablet (20 mg total) by mouth daily. 90 tablet 3   No current facility-administered medications for this visit.     OBJECTIVE: Middle-aged white woman no acute distress  Vitals:   09/12/18 1200  BP: 103/69  Pulse: 61  Resp: 18  Temp: 98.4  F (36.9 C)  SpO2: 100%     Body mass index is 22.38 kg/m.    ECOG FS:1 - Symptomatic but completely ambulatory  Sclerae unicteric, EOMs intact Wearing a mask No cervical or supraclavicular adenopathy Lungs no rales or rhonchi Heart regular rate and rhythm Abd soft, nontender, positive bowel sounds MSK no focal spinal tenderness, no upper extremity lymphedema Neuro: nonfocal, well oriented, appropriate affect Breasts: On the right she is status post lumpectomy followed by radiation with no evidence of disease recurrence.  The left breast is benign.  Both axillae are benign.   LAB RESULTS:  CMP     Component Value Date/Time   NA 140 05/14/2017 1345   NA 141 05/09/2016 0922   K 3.6 05/14/2017 1345   K 4.2 05/09/2016 0922   CL 108 05/14/2017 1345   CO2 28 05/14/2017 1345   CO2 27 05/09/2016 0922   GLUCOSE 98 05/14/2017 1345   GLUCOSE 88 05/09/2016 0922   BUN 8 05/14/2017 1345   BUN 9.7 05/09/2016 0922   CREATININE 0.82 05/14/2017 1345   CREATININE 0.8 05/09/2016 0947  CALCIUM 9.2 05/14/2017 1345   CALCIUM 9.2 05/09/2016 0922   PROT 6.7 05/14/2017 1345   PROT 6.4 05/09/2016 0922   ALBUMIN 3.8 05/14/2017 1345   ALBUMIN 3.6 05/09/2016 0922   AST 33 05/14/2017 1345   AST 34 05/09/2016 0922   ALT 8 05/14/2017 1345   ALT 10 05/09/2016 0922   ALKPHOS 54 05/14/2017 1345   ALKPHOS 74 05/09/2016 0922   BILITOT 0.5 05/14/2017 1345   BILITOT 0.93 05/09/2016 0922   GFRNONAA >60 05/14/2017 1345   GFRAA >60 05/14/2017 1345    INo results found for: SPEP, UPEP  Lab Results  Component Value Date   WBC 5.3 05/14/2017   NEUTROABS 3.2 05/14/2017   HGB 12.7 05/14/2017   HCT 38.6 05/14/2017   MCV 89.6 05/14/2017   PLT 190 05/14/2017      Chemistry      Component Value Date/Time   NA 140 05/14/2017 1345   NA 141 05/09/2016 0922   K 3.6 05/14/2017 1345   K 4.2 05/09/2016 0922   CL 108 05/14/2017 1345   CO2 28 05/14/2017 1345   CO2 27 05/09/2016 0922   BUN 8 05/14/2017  1345   BUN 9.7 05/09/2016 0922   CREATININE 0.82 05/14/2017 1345   CREATININE 0.8 05/09/2016 0922      Component Value Date/Time   CALCIUM 9.2 05/14/2017 1345   CALCIUM 9.2 05/09/2016 0922   ALKPHOS 54 05/14/2017 1345   ALKPHOS 74 05/09/2016 0922   AST 33 05/14/2017 1345   AST 34 05/09/2016 0922   ALT 8 05/14/2017 1345   ALT 10 05/09/2016 0922   BILITOT 0.5 05/14/2017 1345   BILITOT 0.93 05/09/2016 0922       No results found for: LABCA2  No components found for: LABCA125  No results for input(s): INR in the last 168 hours.  Urinalysis No results found for: COLORURINE, APPEARANCEUR, LABSPEC, PHURINE, GLUCOSEU, HGBUR, BILIRUBINUR, KETONESUR, PROTEINUR, UROBILINOGEN, NITRITE, LEUKOCYTESUR   ELIGIBLE FOR AVAILABLE RESEARCH PROTOCOL: No   STUDIES: No results found.   ASSESSMENT: 48 y.o. BRCA negative [place] woman status post right breast upper inner quadrant biopsy 03/25/2015 for a clinical T1BN0 invasive ductal carcinoma, grade 1, estrogen and progesterone receptor positive, HER-2 nonamplified, with an MIB-1 of 3%  (1) status post right lumpectomy and sentinel lymph node sampling 05/27/2015 for a pT1b pN0, stage IA invasive ductal carcinoma, grade 1, repeat HER-2 again negative.  (2) Oncotype DX score of 17 predicts a risk of recurrence outside the breast within 10 years of 11% if the patient's only systemic therapy is tamoxifen for 5 years  (3) adjuvant radiation  completed 08/20/2015   (4) started tamoxifen 09/24/2015   (a) [place] and estradiol level May 2019 consistent with menopause  (5) genetics testing 04/13/2015 through the Breast/Ovarian/Endometrial Custom gene panel offered by [facility] found no deleterious mutations in ATM, BARD1, BRCA1, BRCA2, BRIP1, CDH1, CHEK2, EPCAM, FANCC, MLH1, MSH2, MSH6, MUTYH, NBN, PALB2, PMS2, POLD1, PTEN, RAD51C, RAD51D, TP53, and XRCC2.    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    (6) mole right anterior thigh, nonpigmented, likely malignant  (a) benign biopsy 2018, with resolution  PLAN: Tranesha is now 3 years out from definitive surgery for her breast cancer with no evidence of disease recurrence.  This is very favorable.  She is tolerating tamoxifen well and the plan is to continue for a total of 5 years.  The vaginal dryness issue persists.  She is using some Replens which helps.  I am comfortable with her using Estrace twice a week.  She understands that while she is on tamoxifen it is safe for her to use vaginal estrogens.  If she does stop tamoxifen at 5 years however that would become more problematic.  They are keeping appropriate pandemic precautions.  She knows to call for any other issue that may develop before her next visit here.  Chauntae Hults, Virgie Dad, MD  09/12/18 12:22 PM Medical Oncology and Hematology [facility] [address] [place],  [address] Tel. [phone]    Fax. [phone]

## 2018-09-12 ENCOUNTER — Inpatient Hospital Stay: Payer: BC Managed Care – PPO | Attending: Oncology | Admitting: Oncology

## 2018-09-12 ENCOUNTER — Other Ambulatory Visit: Payer: Self-pay

## 2018-09-12 VITALS — BP 103/69 | HR 61 | Temp 98.4°F | Resp 18 | Wt 134.5 lb

## 2018-09-12 DIAGNOSIS — Z8049 Family history of malignant neoplasm of other genital organs: Secondary | ICD-10-CM | POA: Insufficient documentation

## 2018-09-12 DIAGNOSIS — C50211 Malignant neoplasm of upper-inner quadrant of right female breast: Secondary | ICD-10-CM | POA: Insufficient documentation

## 2018-09-12 DIAGNOSIS — Z923 Personal history of irradiation: Secondary | ICD-10-CM | POA: Insufficient documentation

## 2018-09-12 DIAGNOSIS — Z808 Family history of malignant neoplasm of other organs or systems: Secondary | ICD-10-CM | POA: Insufficient documentation

## 2018-09-12 DIAGNOSIS — Z803 Family history of malignant neoplasm of breast: Secondary | ICD-10-CM | POA: Diagnosis not present

## 2018-09-12 DIAGNOSIS — Z800 Family history of malignant neoplasm of digestive organs: Secondary | ICD-10-CM | POA: Insufficient documentation

## 2018-09-12 DIAGNOSIS — Z170 Estrogen receptor positive status [ER+]: Secondary | ICD-10-CM | POA: Diagnosis not present

## 2018-09-12 DIAGNOSIS — Z79810 Long term (current) use of selective estrogen receptor modulators (SERMs): Secondary | ICD-10-CM | POA: Insufficient documentation

## 2018-09-12 DIAGNOSIS — D2271 Melanocytic nevi of right lower limb, including hip: Secondary | ICD-10-CM | POA: Insufficient documentation

## 2018-09-12 MED ORDER — ESTRADIOL 0.1 MG/GM VA CREA: 1.0000 | TOPICAL_CREAM | Freq: Every day | VAGINAL | 12 refills | Status: DC

## 2018-09-12 MED ORDER — TAMOXIFEN CITRATE 20 MG PO TABS: 20.0000 mg | ORAL_TABLET | Freq: Every day | ORAL | 3 refills | Status: DC

## 2018-09-13 ENCOUNTER — Telehealth: Payer: Self-pay | Admitting: Oncology

## 2018-09-13 NOTE — Telephone Encounter (Signed)
I talk with patient regarding schedule  

## 2019-03-20 ENCOUNTER — Other Ambulatory Visit: Payer: Self-pay | Admitting: Urology

## 2019-03-20 ENCOUNTER — Other Ambulatory Visit: Payer: Self-pay | Admitting: Obstetrics and Gynecology

## 2019-03-20 DIAGNOSIS — Z9889 Other specified postprocedural states: Secondary | ICD-10-CM

## 2019-03-21 ENCOUNTER — Other Ambulatory Visit: Payer: Self-pay | Admitting: Oncology

## 2019-03-21 DIAGNOSIS — Z9889 Other specified postprocedural states: Secondary | ICD-10-CM

## 2019-06-10 ENCOUNTER — Other Ambulatory Visit: Payer: Self-pay

## 2019-06-10 ENCOUNTER — Ambulatory Visit
Admission: RE | Admit: 2019-06-10 | Discharge: 2019-06-10 | Disposition: A | Discharge status: Home / Self Care | Payer: BC Managed Care – PPO | Source: Ambulatory Visit | Attending: Oncology | Admitting: Oncology

## 2019-06-10 DIAGNOSIS — Z9889 Other specified postprocedural states: Secondary | ICD-10-CM

## 2019-06-18 ENCOUNTER — Telehealth: Payer: Self-pay | Admitting: Oncology

## 2019-06-18 NOTE — Telephone Encounter (Signed)
Scheduled per 6/8 sch message. R/s appt to 9/20 per pt request.

## 2019-09-16 ENCOUNTER — Ambulatory Visit: Payer: BC Managed Care – PPO | Admitting: Oncology

## 2019-09-16 ENCOUNTER — Other Ambulatory Visit: Payer: BC Managed Care – PPO

## 2019-09-24 ENCOUNTER — Other Ambulatory Visit: Payer: Self-pay | Admitting: *Deleted

## 2019-09-24 ENCOUNTER — Other Ambulatory Visit: Payer: Self-pay

## 2019-09-24 ENCOUNTER — Ambulatory Visit (HOSPITAL_COMMUNITY)
Admission: RE | Admit: 2019-09-24 | Discharge: 2019-09-24 | Disposition: A | Discharge status: Home / Self Care | Payer: BC Managed Care – PPO | Source: Ambulatory Visit | Attending: Oncology | Admitting: Oncology

## 2019-09-24 ENCOUNTER — Telehealth: Payer: Self-pay | Admitting: *Deleted

## 2019-09-24 DIAGNOSIS — Z79810 Long term (current) use of selective estrogen receptor modulators (SERMs): Secondary | ICD-10-CM

## 2019-09-24 DIAGNOSIS — M79651 Pain in right thigh: Secondary | ICD-10-CM

## 2019-09-24 DIAGNOSIS — C50211 Malignant neoplasm of upper-inner quadrant of right female breast: Secondary | ICD-10-CM | POA: Diagnosis present

## 2019-09-24 DIAGNOSIS — Z170 Estrogen receptor positive status [ER+]: Secondary | ICD-10-CM | POA: Diagnosis present

## 2019-09-24 NOTE — Progress Notes (Signed)
Lower extremity venous has been completed.   Preliminary results in CV Proc.   [name] 09/24/2019 3:33 PM

## 2019-09-24 NOTE — Telephone Encounter (Signed)
This RN spoke to pt per her VM stating concern for new right thigh pain " and am concerned it could be a DVT or blood clot "  This RN discussed above- and reviewed with MD- doppler ordered and scheduled for today at 3 pm at [facility].  Pt aware of above .

## 2019-09-26 ENCOUNTER — Other Ambulatory Visit: Payer: Self-pay | Admitting: *Deleted

## 2019-09-26 DIAGNOSIS — Z170 Estrogen receptor positive status [ER+]: Secondary | ICD-10-CM

## 2019-09-26 DIAGNOSIS — C50211 Malignant neoplasm of upper-inner quadrant of right female breast: Secondary | ICD-10-CM

## 2019-09-27 ENCOUNTER — Other Ambulatory Visit: Payer: Self-pay | Admitting: Oncology

## 2019-09-28 NOTE — Progress Notes (Signed)
[name]  [phone] Fax:([phone]) [phone]     ID: Renee Wright DOB: 10-Jun-1970  MR#: [id number]  ENI#:[id number]  Patient Care Team: Renee Hipps, MD as PCP - General (Family Medicine) Renee Millers, MD as PCP - OBGYN (Obstetrics and Gynecology) Renee Wright, Renee Dad, MD as Consulting Physician (Oncology) Renee Luna, MD as Consulting Physician (General Surgery) Renee Gibson, MD as Attending Physician (Radiation Oncology) OTHER MD:  CHIEF COMPLAINT: Estrogen receptor positive invasive breast cancer  CURRENT TREATMENT: Tamoxifen   INTERVAL HISTORY: Renee Wright returns today for follow-up of her estrogen receptor positive breast cancer.   She continues on tamoxifen.  She tolerates this well.  She does have some vaginal dryness which tamoxifen actually help.  She is using vaginal estrogens as well as Replens for this.  Since her last visit, she underwent bilateral diagnostic mammography with tomography at [facility] on 06/10/2019 showing: breast density category C; no evidence of malignancy in either breast.  She had some persistent right upper leg discomfort and after 2 or 3 weeks as she became concerned possibly tamoxifen could be causing a clot there.  She underwent lower extremity doppler on 09/24/2019. This was negative for deep vein thrombosis.   REVIEW OF SYSTEMS: Renee Wright is not exercising as frequently as she would like.  She does take a walk 1 or 2 days a week, 45 minutes at a time.  She received the Moderna vaccine x2, with mild symptoms lasting about a day.  Interestingly her husband refuses to be vaccinated as does her older daughter.  Her younger daughter has been vaccinated (incidentally she was recently diagnosed with Renee Wright).  Renee Wright son works mostly in the office but she tells me the environment at work is safe as far as COVID-19 is concerned.  Detailed review of systems today was otherwise stable   BREAST CANCER  HISTORY:  From the original intake note:  Renee Wright had screening mammography 03/02/2015 showing a possible change in her right breast. On 03/15/2015 she underwent right diagnostic mammography with tomosynthesis and [facility]. Breast density was category C. Mammography confirmed an irregular mass in the upper right breast and draining 0.6 cm, with associated microcalcifications. Ultrasound confirmed an irregular hypoechoic mass at the 12:30 o'clock axis measuring 0.6 cm. The right axilla was sonographically normal.  Biopsy of the right breast mass in question 03/25/2015 showed ([id number]) invasive ductal carcinoma, grade 1, estrogen receptor 90% positive, progesterone receptor 90% positive, both with strong staining intensity, with an MIB-1 of 3%, and HER-2 nonamplified, the signals ratio being 0.9 to and the number per cell 1.75.  Her subsequent history is as detailed below   PAST MEDICAL HISTORY: Past Medical History:  Diagnosis Date  . Breast calcification, right 01/2011  . Breast cancer ([place])   . Breast cancer of upper-inner quadrant of right female breast ([place]) 03/26/2015  . Breast cyst 01/13/2011   Right breast at 7:30 areolar edge   . Family history of breast cancer   . Family history of colon cancer   . Family history of melanoma   . Family history of uterine cancer   . IBS (irritable bowel syndrome)   . Personal history of radiation therapy 2017  . PONV (postoperative nausea and vomiting)      PAST SURGICAL HISTORY: Past Surgical History:  Procedure Laterality Date  . BREAST BIOPSY  10/1991  . BREAST BIOPSY  02/02/2011  . BREAST BIOPSY Right 03/20/2015  . BREAST  EXCISIONAL BIOPSY Right 02/02/2011  . BREAST LUMPECTOMY Right 05/27/2015  . BREAST LUMPECTOMY WITH RADIOACTIVE SEED AND SENTINEL LYMPH NODE BIOPSY Right 05/27/2015   Procedure: BREAST LUMPECTOMY WITH RADIOACTIVE SEED AND SENTINEL LYMPH NODE BIOPSY;  Surgeon: Renee Luna, MD;   Location: [facility];  Service: General;  Laterality: Right;  . CESAREAN SECTION  05/29/96, 03/30/00    FAMILY HISTORY Family History  Problem Relation Age of Onset  . Breast cancer Maternal Aunt 62  . Uterine cancer Mother 64  . Melanoma Maternal Grandmother 75       mets to bladder  . Colon cancer Paternal Grandmother        dx over 40  The patient's parents are in their mid 72s as of March 2017. The patient's mother was diagnosed with a rare form of uterine cancer at age 6. She underwent hysterectomy and apparently was cured, with no systemic treatment. The patient's mother had 2 sisters, one of them was diagnosed with breast cancer at the age of 64. In addition the patient's maternal grandmother was diagnosed with melanoma at age 22. On the paternal side there is a history of colon cancer in the paternal grandmother at age 54+. There is no history of ovarian cancer in the family   GYNECOLOGIC HISTORY:  No LMP recorded. (Menstrual status: Other). Menarche age 28 first live birth age 29 the patient is Renee Wright P2. She still having regular periods. She has been on oral contraceptives for 31 years. She was asked to discontinue these at the time of breast cancer diagnosis in March 2017.   SOCIAL HISTORY: (Updated September 2020).  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 Her husband Renee Wright is self-employed in Theme park manager. Daughter Renee Wright is going to [employer] but also staying home part of the time during the pandemic.daughter Renee Wright also lives at home.      ADVANCED DIRECTIVES: Not in place   HEALTH MAINTENANCE: Social History   Tobacco Use  . Smoking status: Never Smoker  . Smokeless tobacco: Never Used  Substance Use Topics  . Alcohol use: Yes  . Drug use: No     Colonoscopy: Due  [id number] 2017  Bone density:  Lipid panel:  No Known Allergies  Current Outpatient Medications  Medication Sig Dispense Refill  . estradiol (ESTRACE  VAGINAL) 0.1 MG/GM vaginal cream Place 1 Applicatorful vaginally at bedtime. 42.5 g 12  . tamoxifen (NOLVADEX) 20 MG tablet Take 1 tablet (20 mg total) by mouth daily. 90 tablet 4   No current facility-administered medications for this visit.    OBJECTIVE: White woman who appears well  Vitals:   09/29/19 1502  BP: 109/62  Pulse: 64  Resp: 18  Temp: 97.6 F (36.4 C)  SpO2: 100%     Body mass index is 22.13 kg/m.    ECOG FS:1 - Symptomatic but completely ambulatory  Sclerae unicteric, EOMs intact Wearing a mask No cervical or supraclavicular adenopathy Lungs no rales or rhonchi Heart regular rate and rhythm Abd soft, nontender, positive bowel sounds MSK no focal spinal tenderness, no upper extremity lymphedema Neuro: nonfocal, well oriented, appropriate affect Breasts: Status post right lumpectomy and radiation.  There is no evidence of Wright recurrence.  The left breast is unremarkable.  Both axillae are benign.   LAB RESULTS:  CMP     Component Value Date/Time   NA 141 09/29/2019 1444   NA 141 05/09/2016 0922   K 3.3 (L) 09/29/2019 1444   K  4.2 05/09/2016 0922   CL 108 09/29/2019 1444   CO2 27 09/29/2019 1444   CO2 27 05/09/2016 0922   GLUCOSE 95 09/29/2019 1444   GLUCOSE 88 05/09/2016 0922   BUN 10 09/29/2019 1444   BUN 9.7 05/09/2016 0922   CREATININE 0.85 09/29/2019 1444   CREATININE 0.8 05/09/2016 0922   CALCIUM 8.9 09/29/2019 1444   CALCIUM 9.2 05/09/2016 0922   PROT 6.5 09/29/2019 1444   PROT 6.4 05/09/2016 0922   ALBUMIN 3.6 09/29/2019 1444   ALBUMIN 3.6 05/09/2016 0922   AST 38 09/29/2019 1444   AST 34 05/09/2016 0922   ALT 14 09/29/2019 1444   ALT 10 05/09/2016 0922   ALKPHOS 53 09/29/2019 1444   ALKPHOS 74 05/09/2016 0922   BILITOT 0.7 09/29/2019 1444   BILITOT 0.93 05/09/2016 0922   GFRNONAA >60 09/29/2019 1444   GFRAA >60 09/29/2019 1444    INo results found for: SPEP, UPEP  Lab Results  Component Value Date   WBC 6.5 09/29/2019    NEUTROABS 4.3 09/29/2019   HGB 12.3 09/29/2019   HCT 37.9 09/29/2019   MCV 88.8 09/29/2019   PLT 200 09/29/2019      Chemistry      Component Value Date/Time   NA 141 09/29/2019 1444   NA 141 05/09/2016 0922   K 3.3 (L) 09/29/2019 1444   K 4.2 05/09/2016 0922   CL 108 09/29/2019 1444   CO2 27 09/29/2019 1444   CO2 27 05/09/2016 0922   BUN 10 09/29/2019 1444   BUN 9.7 05/09/2016 0922   CREATININE 0.85 09/29/2019 1444   CREATININE 0.8 05/09/2016 0922      Component Value Date/Time   CALCIUM 8.9 09/29/2019 1444   CALCIUM 9.2 05/09/2016 0922   ALKPHOS 53 09/29/2019 1444   ALKPHOS 74 05/09/2016 0922   AST 38 09/29/2019 1444   AST 34 05/09/2016 0922   ALT 14 09/29/2019 1444   ALT 10 05/09/2016 0922   BILITOT 0.7 09/29/2019 1444   BILITOT 0.93 05/09/2016 0922       No results found for: LABCA2  No components found for: LABCA125  No results for input(s): INR in the last 168 hours.  Urinalysis No results found for: COLORURINE, APPEARANCEUR, LABSPEC, PHURINE, GLUCOSEU, HGBUR, BILIRUBINUR, KETONESUR, PROTEINUR, UROBILINOGEN, NITRITE, LEUKOCYTESUR   ELIGIBLE FOR AVAILABLE RESEARCH PROTOCOL: No   STUDIES: VAS [place] LOWER EXTREMITY VENOUS (DVT)  Result Date: 09/24/2019  Lower Venous DVTStudy Indications: Pain.  Comparison Study: no prior Performing Technologist: [name] RVS  Examination Guidelines: A complete evaluation includes B-mode imaging, spectral Doppler, color Doppler, and power Doppler as needed of all accessible portions of each vessel. Bilateral testing is considered an integral part of a complete examination. Limited examinations for reoccurring indications may be performed as noted. The reflux portion of the exam is performed with the patient in reverse Trendelenburg.  +---------+---------------+---------+-----------+----------+--------------+ RIGHT    CompressibilityPhasicitySpontaneityPropertiesThrombus Aging  +---------+---------------+---------+-----------+----------+--------------+ CFV      Full           Yes      Yes                                 +---------+---------------+---------+-----------+----------+--------------+ SFJ      Full                                                        +---------+---------------+---------+-----------+----------+--------------+  FV Prox  Full                                                        +---------+---------------+---------+-----------+----------+--------------+ FV Mid   Full                                                        +---------+---------------+---------+-----------+----------+--------------+ FV DistalFull                                                        +---------+---------------+---------+-----------+----------+--------------+ PFV      Full                                                        +---------+---------------+---------+-----------+----------+--------------+ POP      Full           Yes      Yes                                 +---------+---------------+---------+-----------+----------+--------------+ PTV      Full                                                        +---------+---------------+---------+-----------+----------+--------------+ PERO     Full                                                        +---------+---------------+---------+-----------+----------+--------------+   +----+---------------+---------+-----------+----------+--------------+ LEFTCompressibilityPhasicitySpontaneityPropertiesThrombus Aging +----+---------------+---------+-----------+----------+--------------+ CFV Full           Yes      Yes                                 +----+---------------+---------+-----------+----------+--------------+     Summary: RIGHT: - There is no evidence of deep vein thrombosis in the lower extremity.  - No cystic structure found in the popliteal fossa.   LEFT: - No evidence of common femoral vein obstruction.  *See table(s) above for measurements and observations. Electronically signed by Harold Barban MD on 09/24/2019 at 5:22:24 PM.    Final      ASSESSMENT: 49 y.o. BRCA negative [place] woman status post right breast upper inner quadrant biopsy 03/25/2015 for a clinical T1BN0 invasive ductal carcinoma, grade 1, estrogen and progesterone receptor positive, HER-2 nonamplified, with an MIB-1 of 3%  (1) status post right lumpectomy and sentinel lymph node sampling 05/27/2015 for  a pT1b pN0, stage IA invasive ductal carcinoma, grade 1, repeat HER-2 again negative.  (2) Oncotype DX score of 17 predicts a risk of recurrence outside the breast within 10 years of 11% if the patient's only systemic therapy is tamoxifen for 5 years  (3) adjuvant radiation  completed 08/20/2015   (4) started tamoxifen 09/24/2015   (a) [place] and estradiol level May 2019 consistent with menopause  (5) genetics testing 04/13/2015 through the Breast/Ovarian/Endometrial Custom gene panel offered by [facility] found no deleterious mutations in ATM, BARD1, BRCA1, BRCA2, BRIP1, CDH1, CHEK2, EPCAM, FANCC, MLH1, MSH2, MSH6, MUTYH, NBN, PALB2, PMS2, POLD1, PTEN, RAD51C, RAD51D, TP53, and XRCC2.    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   (6) mole right anterior thigh, nonpigmented  (a) benign biopsy 2018, with resolution   PLAN: Velora is now close to 4-1/2 years out from definitive surgery for her breast cancer with no evidence of Wright recurrence.  This is very favorable.  She is tolerating tamoxifen well and the plan will be to continue that a total of 5 years.  When she sees me next year and is ready to discontinue tamoxifen she will have to make a decision regarding the vaginal estrogens.  She understands we really have no data regarding safety or lack of safety using these agents vaginally when not under her  tamoxifen.  I commended her receiving the vaccine.  I encouraged her to increase her exercise tolerance  Total encounter time 25 minutes.*  Renee Wright, Renee Dad, MD  09/29/19 3:25 PM Medical Oncology and Hematology [facility] [place], [place] [address] Tel. [phone]    Fax. [phone]   I, Wilburn Mylar, am acting as scribe for Dr. Virgie Wright. Sofie Schendel.  I, Lurline Del MD, have reviewed the above documentation for accuracy and completeness, and I agree with the above.    *Total Encounter Time as defined by the Centers for Medicare and Medicaid Services includes, in addition to the face-to-face time of a patient visit (documented in the note above) non-face-to-face time: obtaining and reviewing outside history, ordering and reviewing medications, tests or procedures, care coordination (communications with other health care professionals or caregivers) and documentation in the medical record.

## 2019-09-29 ENCOUNTER — Telehealth: Payer: Self-pay | Admitting: Oncology

## 2019-09-29 ENCOUNTER — Inpatient Hospital Stay: Payer: BC Managed Care – PPO | Attending: Oncology

## 2019-09-29 ENCOUNTER — Inpatient Hospital Stay: Payer: BC Managed Care – PPO | Admitting: Oncology

## 2019-09-29 ENCOUNTER — Other Ambulatory Visit: Payer: Self-pay

## 2019-09-29 VITALS — BP 109/62 | HR 64 | Temp 97.6°F | Resp 18 | Ht 65.0 in | Wt 133.0 lb

## 2019-09-29 DIAGNOSIS — Z170 Estrogen receptor positive status [ER+]: Secondary | ICD-10-CM

## 2019-09-29 DIAGNOSIS — Z79810 Long term (current) use of selective estrogen receptor modulators (SERMs): Secondary | ICD-10-CM | POA: Insufficient documentation

## 2019-09-29 DIAGNOSIS — K5090 Crohn's disease, unspecified, without complications: Secondary | ICD-10-CM | POA: Insufficient documentation

## 2019-09-29 DIAGNOSIS — Z800 Family history of malignant neoplasm of digestive organs: Secondary | ICD-10-CM | POA: Insufficient documentation

## 2019-09-29 DIAGNOSIS — Z803 Family history of malignant neoplasm of breast: Secondary | ICD-10-CM | POA: Diagnosis not present

## 2019-09-29 DIAGNOSIS — Z8049 Family history of malignant neoplasm of other genital organs: Secondary | ICD-10-CM | POA: Insufficient documentation

## 2019-09-29 DIAGNOSIS — C50211 Malignant neoplasm of upper-inner quadrant of right female breast: Secondary | ICD-10-CM | POA: Diagnosis present

## 2019-09-29 DIAGNOSIS — Z808 Family history of malignant neoplasm of other organs or systems: Secondary | ICD-10-CM | POA: Insufficient documentation

## 2019-09-29 LAB — CMP (CANCER CENTER ONLY)
ALT: 14 U/L (ref 0–44)
AST: 38 U/L (ref 15–41)
Albumin: 3.6 g/dL (ref 3.5–5.0)
Alkaline Phosphatase: 53 U/L (ref 38–126)
Anion gap: 6 (ref 5–15)
BUN: 10 mg/dL (ref 6–20)
CO2: 27 mmol/L (ref 22–32)
Calcium: 8.9 mg/dL (ref 8.9–10.3)
Chloride: 108 mmol/L (ref 98–111)
Creatinine: 0.85 mg/dL (ref 0.44–1.00)
GFR, Est AFR Am: >60 mL/min (ref >60)
GFR, Estimated: >60 mL/min (ref >60)
Glucose, Bld: 95 mg/dL (ref 70–99)
Potassium: 3.3 mmol/L — ABNORMAL LOW (ref 3.5–5.1)
Sodium: 141 mmol/L (ref 135–145)
Total Bilirubin: 0.7 mg/dL (ref 0.3–1.2)
Total Protein: 6.5 g/dL (ref 6.5–8.1)

## 2019-09-29 LAB — CBC WITH DIFFERENTIAL (CANCER CENTER ONLY)
Abs Immature Granulocytes: 0.02 10*3/uL (ref 0.00–0.07)
Basophils Absolute: 0.1 10*3/uL (ref 0.0–0.1)
Basophils Relative: 1 %
Eosinophils Absolute: 0.1 10*3/uL (ref 0.0–0.5)
Eosinophils Relative: 1 %
HCT: 37.9 % (ref 36.0–46.0)
Hemoglobin: 12.3 g/dL (ref 12.0–15.0)
Immature Granulocytes: 0 %
Lymphocytes Relative: 24 %
Lymphs Abs: 1.6 10*3/uL (ref 0.7–4.0)
MCH: 28.8 pg (ref 26.0–34.0)
MCHC: 32.5 g/dL (ref 30.0–36.0)
MCV: 88.8 fL (ref 80.0–100.0)
Monocytes Absolute: 0.6 10*3/uL (ref 0.1–1.0)
Monocytes Relative: 9 %
Neutro Abs: 4.3 10*3/uL (ref 1.7–7.7)
Neutrophils Relative %: 65 %
Platelet Count: 200 10*3/uL (ref 150–400)
RBC: 4.27 MIL/uL (ref 3.87–5.11)
RDW: 13.3 % (ref 11.5–15.5)
WBC Count: 6.5 10*3/uL (ref 4.0–10.5)
nRBC: 0 % (ref 0.0–0.2)

## 2019-09-29 MED ORDER — ESTRADIOL 0.1 MG/GM VA CREA: 1.0000 | TOPICAL_CREAM | Freq: Every day | VAGINAL | 12 refills | Status: AC

## 2019-09-29 MED ORDER — TAMOXIFEN CITRATE 20 MG PO TABS: 20.0000 mg | ORAL_TABLET | Freq: Every day | ORAL | 4 refills | Status: AC

## 2019-09-29 NOTE — Telephone Encounter (Signed)
Scheduled appts per 9/20 los. Gave pt a print out of AVS.  

## 2020-05-25 ENCOUNTER — Other Ambulatory Visit: Payer: Self-pay | Admitting: Oncology

## 2020-05-25 DIAGNOSIS — Z9889 Other specified postprocedural states: Secondary | ICD-10-CM

## 2020-07-19 ENCOUNTER — Other Ambulatory Visit: Payer: Self-pay

## 2020-07-19 ENCOUNTER — Ambulatory Visit
Admission: RE | Admit: 2020-07-19 | Discharge: 2020-07-19 | Disposition: A | Discharge status: Home / Self Care | Payer: BC Managed Care – PPO | Source: Ambulatory Visit | Attending: Oncology | Admitting: Oncology

## 2020-07-19 DIAGNOSIS — Z9889 Other specified postprocedural states: Secondary | ICD-10-CM

## 2020-09-27 ENCOUNTER — Other Ambulatory Visit: Payer: Self-pay

## 2020-09-27 DIAGNOSIS — Z170 Estrogen receptor positive status [ER+]: Secondary | ICD-10-CM

## 2020-09-27 DIAGNOSIS — C50211 Malignant neoplasm of upper-inner quadrant of right female breast: Secondary | ICD-10-CM

## 2020-09-28 ENCOUNTER — Other Ambulatory Visit: Payer: Self-pay

## 2020-09-28 ENCOUNTER — Inpatient Hospital Stay: Payer: BC Managed Care – PPO | Attending: Oncology | Admitting: Oncology

## 2020-09-28 ENCOUNTER — Inpatient Hospital Stay: Payer: BC Managed Care – PPO

## 2020-09-28 VITALS — BP 109/65 | HR 65 | Temp 97.6°F | Resp 16 | Ht 65.0 in | Wt 133.8 lb

## 2020-09-28 DIAGNOSIS — Z170 Estrogen receptor positive status [ER+]: Secondary | ICD-10-CM | POA: Diagnosis not present

## 2020-09-28 DIAGNOSIS — C50211 Malignant neoplasm of upper-inner quadrant of right female breast: Secondary | ICD-10-CM

## 2020-09-28 DIAGNOSIS — Z800 Family history of malignant neoplasm of digestive organs: Secondary | ICD-10-CM | POA: Insufficient documentation

## 2020-09-28 DIAGNOSIS — Z853 Personal history of malignant neoplasm of breast: Secondary | ICD-10-CM | POA: Insufficient documentation

## 2020-09-28 DIAGNOSIS — Z808 Family history of malignant neoplasm of other organs or systems: Secondary | ICD-10-CM | POA: Insufficient documentation

## 2020-09-28 DIAGNOSIS — Z8049 Family history of malignant neoplasm of other genital organs: Secondary | ICD-10-CM | POA: Diagnosis not present

## 2020-09-28 DIAGNOSIS — Z803 Family history of malignant neoplasm of breast: Secondary | ICD-10-CM | POA: Insufficient documentation

## 2020-09-28 DIAGNOSIS — N941 Unspecified dyspareunia: Secondary | ICD-10-CM | POA: Insufficient documentation

## 2020-09-28 DIAGNOSIS — Z923 Personal history of irradiation: Secondary | ICD-10-CM | POA: Insufficient documentation

## 2020-09-28 LAB — CMP (CANCER CENTER ONLY)
ALT: 8 U/L (ref 0–44)
AST: 32 U/L (ref 15–41)
Albumin: 3.8 g/dL (ref 3.5–5.0)
Alkaline Phosphatase: 58 U/L (ref 38–126)
Anion gap: 7 (ref 5–15)
BUN: 11 mg/dL (ref 6–20)
CO2: 26 mmol/L (ref 22–32)
Calcium: 9.3 mg/dL (ref 8.9–10.3)
Chloride: 108 mmol/L (ref 98–111)
Creatinine: 0.82 mg/dL (ref 0.44–1.00)
GFR, Estimated: >60 mL/min (ref >60)
Glucose, Bld: 90 mg/dL (ref 70–99)
Potassium: 3.7 mmol/L (ref 3.5–5.1)
Sodium: 141 mmol/L (ref 135–145)
Total Bilirubin: 0.5 mg/dL (ref 0.3–1.2)
Total Protein: 6.6 g/dL (ref 6.5–8.1)

## 2020-09-28 LAB — CBC WITH DIFFERENTIAL (CANCER CENTER ONLY)
Abs Immature Granulocytes: 0.02 10*3/uL (ref 0.00–0.07)
Basophils Absolute: 0 10*3/uL (ref 0.0–0.1)
Basophils Relative: 1 %
Eosinophils Absolute: 0.1 10*3/uL (ref 0.0–0.5)
Eosinophils Relative: 1 %
HCT: 38.6 % (ref 36.0–46.0)
Hemoglobin: 12.6 g/dL (ref 12.0–15.0)
Immature Granulocytes: 0 %
Lymphocytes Relative: 27 %
Lymphs Abs: 1.4 10*3/uL (ref 0.7–4.0)
MCH: 28.9 pg (ref 26.0–34.0)
MCHC: 32.6 g/dL (ref 30.0–36.0)
MCV: 88.5 fL (ref 80.0–100.0)
Monocytes Absolute: 0.6 10*3/uL (ref 0.1–1.0)
Monocytes Relative: 10 %
Neutro Abs: 3.3 10*3/uL (ref 1.7–7.7)
Neutrophils Relative %: 61 %
Platelet Count: 208 10*3/uL (ref 150–400)
RBC: 4.36 MIL/uL (ref 3.87–5.11)
RDW: 13.2 % (ref 11.5–15.5)
WBC Count: 5.4 10*3/uL (ref 4.0–10.5)
nRBC: 0 % (ref 0.0–0.2)

## 2020-09-28 NOTE — Progress Notes (Signed)
[place]  Telephone:([phone] Fax:(336) [phone]     ID: Renee Wright DOB: [birth year]  MR#: [id number]  JJO#:[id number]  Patient Care Team: Ronita Hipps, MD as PCP - General (Family Medicine) Olga Millers, MD as PCP - OBGYN (Obstetrics and Gynecology) Taraya Steward, Virgie Dad, MD as Consulting Physician (Oncology) Erroll Luna, MD as Consulting Physician (General Surgery) Eppie Gibson, MD as Attending Physician (Radiation Oncology) OTHER MD:  CHIEF COMPLAINT: Estrogen receptor positive invasive breast cancer  CURRENT TREATMENT: Completing 5 years tamoxifen   INTERVAL HISTORY: Ruie returns today for follow-up of her estrogen receptor positive breast cancer.   She continues on tamoxifen.  She has tolerated this well, with no hot flashes or vaginal wetness.  Undercover of tamoxifen she did try Estrace cream but found it messy and only used it 1 time.  Since her last visit, she underwent bilateral diagnostic mammography with tomography at [facility] on 07/19/2020 showing: breast density category C; no evidence of malignancy in either breast.   REVIEW OF SYSTEMS: Renee Wright continues to work full-time.  She walks at least 3 miles at least 3 days a week.  She does have some problems with dyspareunia and is using Replens perhaps not as regularly as she could.  She injured her left rib cage while exercising [place] the beach and then her dog jumped on the same spot.  She is still a little bit sensitive in that area but has had no breathing problems, cough, or worsening symptoms.  Detailed review of systems today was otherwise stable  COVID: Status post Moderna x2 followed by 1 booster  BREAST CANCER HISTORY:  From the original intake note:  Renee Wright had screening mammography 03/02/2015 showing a possible change in her right breast. On 03/15/2015 she underwent right diagnostic mammography with tomosynthesis and right breast ultrasonography at the [facility]. Breast density was category C. Mammography confirmed an irregular mass in the upper right breast and draining 0.6 cm, with associated microcalcifications. Ultrasound confirmed an irregular hypoechoic mass at the 12:30 o'clock axis measuring 0.6 cm. The right axilla was sonographically normal.  Biopsy of the right breast mass in question 03/25/2015 showed ([id number]) invasive ductal carcinoma, grade 1, estrogen receptor 90% positive, progesterone receptor 90% positive, both with strong staining intensity, with an MIB-1 of 3%, and HER-2 nonamplified, the signals ratio being 0.9 to and the number per cell 1.75.  Her subsequent history is as detailed below   PAST MEDICAL HISTORY: Past Medical History:  Diagnosis Date   Breast calcification, right 01/2011   Breast cancer ([place])    Breast cancer of upper-inner quadrant of right female breast ([place]) 03/26/2015   Breast cyst 01/13/2011   Right breast at 7:30 areolar edge    Family history of breast cancer    Family history of colon cancer    Family history of melanoma    Family history of uterine cancer    IBS (irritable bowel syndrome)    Personal history of radiation therapy 2017   PONV (postoperative nausea and vomiting)      PAST SURGICAL HISTORY: Past Surgical History:  Procedure Laterality Date   BREAST BIOPSY  10/1991   BREAST BIOPSY  02/02/2011   BREAST BIOPSY Right 03/20/2015   BREAST EXCISIONAL BIOPSY Right 02/02/2011   BREAST LUMPECTOMY Right 05/27/2015   BREAST LUMPECTOMY WITH RADIOACTIVE SEED AND SENTINEL LYMPH NODE BIOPSY Right 05/27/2015   Procedure: BREAST LUMPECTOMY WITH RADIOACTIVE SEED AND SENTINEL LYMPH NODE BIOPSY;  Surgeon: Marcello Moores  Cornett, MD;  Location: [facility];  Service: General;  Laterality: Right;   CESAREAN SECTION  05/29/96, 03/30/00    FAMILY HISTORY Family History  Problem Relation Age of Onset   Breast cancer Maternal Aunt 62   Uterine cancer Mother 26   Melanoma Maternal Grandmother  76       mets to bladder   Colon cancer Paternal Grandmother        dx over 1  The patient's parents are in their mid 86s as of March 2017. The patient's mother was diagnosed with a rare form of uterine cancer at age 42. She underwent hysterectomy and apparently was cured, with no systemic treatment. The patient's mother had 2 sisters, one of them was diagnosed with breast cancer at the age of 52. In addition the patient's maternal grandmother was diagnosed with melanoma at age 34. On the paternal side there is a history of colon cancer in the paternal grandmother at age 49+. There is no history of ovarian cancer in the family   GYNECOLOGIC HISTORY:  No LMP recorded. (Menstrual status: Other). Menarche age 60 first live birth age 20 the patient is [place] P2. She still having regular periods. She has been on oral contraceptives for 31 years. She was asked to discontinue these at the time of breast cancer diagnosis in March 2017.   SOCIAL HISTORY: (Updated September 2020).  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 Her husband Renee Wright is self-employed in Theme park manager. Daughter Renee Wright is going to [employer] but also staying home part of the time during the pandemic.daughter Renee Wright also lives at home.      ADVANCED DIRECTIVES: Not in place   HEALTH MAINTENANCE: Social History   Tobacco Use   Smoking status: Never   Smokeless tobacco: Never  Substance Use Topics   Alcohol use: Yes   Drug use: No     Colonoscopy: Due  MOL:MBEMLJQG 2017  Bone density:  Lipid panel:  No Known Allergies  Current Outpatient Medications  Medication Sig Dispense Refill   estradiol (ESTRACE VAGINAL) 0.1 MG/GM vaginal cream Place 1 Applicatorful vaginally at bedtime. 42.5 g 12   tamoxifen (NOLVADEX) 20 MG tablet Take 1 tablet (20 mg total) by mouth daily. 90 tablet 4   No current facility-administered medications for this visit.    OBJECTIVE: White woman who appears well  Vitals:    09/28/20 1446  BP: 109/65  Pulse: 65  Resp: 16  Temp: 97.6 F (36.4 C)  SpO2: 100%     Body mass index is 22.27 kg/m.    ECOG FS:1 - Symptomatic but completely ambulatory  Sclerae unicteric, EOMs intact Wearing a mask No cervical or supraclavicular adenopathy Lungs no rales or rhonchi Heart regular rate and rhythm Abd soft, nontender, positive bowel sounds MSK no focal spinal tenderness, no discomfort in the left rib cage with compression Neuro: nonfocal, well oriented, appropriate affect Breasts: Status post right lumpectomy and radiation.  There is no evidence of disease recurrence.  The left breast and both axillae are benign  LAB RESULTS:  CMP     Component Value Date/Time   NA 141 09/29/2019 1444   NA 141 05/09/2016 0922   K 3.3 (L) 09/29/2019 1444   K 4.2 05/09/2016 0922   CL 108 09/29/2019 1444   CO2 27 09/29/2019 1444   CO2 27 05/09/2016 0922   GLUCOSE 95 09/29/2019 1444   GLUCOSE 88 05/09/2016 0922   BUN 10 09/29/2019 1444  BUN 9.7 05/09/2016 0922   CREATININE 0.85 09/29/2019 1444   CREATININE 0.8 05/09/2016 0922   CALCIUM 8.9 09/29/2019 1444   CALCIUM 9.2 05/09/2016 0922   PROT 6.5 09/29/2019 1444   PROT 6.4 05/09/2016 0922   ALBUMIN 3.6 09/29/2019 1444   ALBUMIN 3.6 05/09/2016 0922   AST 38 09/29/2019 1444   AST 34 05/09/2016 0922   ALT 14 09/29/2019 1444   ALT 10 05/09/2016 0922   ALKPHOS 53 09/29/2019 1444   ALKPHOS 74 05/09/2016 0922   BILITOT 0.7 09/29/2019 1444   BILITOT 0.93 05/09/2016 0922   GFRNONAA >60 09/29/2019 1444   GFRAA >60 09/29/2019 1444    INo results found for: SPEP, UPEP  Lab Results  Component Value Date   WBC 5.4 09/28/2020   NEUTROABS 3.3 09/28/2020   HGB 12.6 09/28/2020   HCT 38.6 09/28/2020   MCV 88.5 09/28/2020   PLT 208 09/28/2020      Chemistry      Component Value Date/Time   NA 141 09/29/2019 1444   NA 141 05/09/2016 0922   K 3.3 (L) 09/29/2019 1444   K 4.2 05/09/2016 0922   CL 108 09/29/2019 1444    CO2 27 09/29/2019 1444   CO2 27 05/09/2016 0922   BUN 10 09/29/2019 1444   BUN 9.7 05/09/2016 0922   CREATININE 0.85 09/29/2019 1444   CREATININE 0.8 05/09/2016 0922      Component Value Date/Time   CALCIUM 8.9 09/29/2019 1444   CALCIUM 9.2 05/09/2016 0922   ALKPHOS 53 09/29/2019 1444   ALKPHOS 74 05/09/2016 0922   AST 38 09/29/2019 1444   AST 34 05/09/2016 0922   ALT 14 09/29/2019 1444   ALT 10 05/09/2016 0922   BILITOT 0.7 09/29/2019 1444   BILITOT 0.93 05/09/2016 0922       No results found for: LABCA2  No components found for: LABCA125  No results for input(s): INR in the last 168 hours.  Urinalysis No results found for: COLORURINE, APPEARANCEUR, LABSPEC, PHURINE, GLUCOSEU, HGBUR, BILIRUBINUR, KETONESUR, PROTEINUR, UROBILINOGEN, NITRITE, LEUKOCYTESUR   ELIGIBLE FOR AVAILABLE RESEARCH PROTOCOL: No   STUDIES: No results found.    ASSESSMENT: 50 y.o. BRCA negative [place] woman status post right breast upper inner quadrant biopsy 03/25/2015 for a clinical T1b N0 invasive ductal carcinoma, grade 1, estrogen and progesterone receptor positive, HER-2 nonamplified, with an MIB-1 of 3%  (1) status post right lumpectomy and sentinel lymph node sampling 05/27/2015 for a pT1b pN0, stage IA invasive ductal carcinoma, grade 1, repeat HER-2 again negative.  (2) Oncotype DX score of 17 predicts a risk of recurrence outside the breast within 10 years of 11% if the patient's only systemic therapy is tamoxifen for 5 years  (3) adjuvant radiation  completed 08/20/2015   (4) started tamoxifen 09/24/2015, completing 5 years September 2022  (a) [facility] and estradiol level May 2019 consistent with menopause  (5) genetics testing 04/13/2015 through the Breast/Ovarian/Endometrial Custom gene panel offered by [facility] found no deleterious mutations in ATM, BARD1, BRCA1, BRCA2, BRIP1, CDH1, CHEK2, EPCAM, FANCC, MLH1, MSH2, MSH6, MUTYH, NBN, PALB2, PMS2, POLD1, PTEN, RAD51C, RAD51D, TP53, and  XRCC2.    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   (6) mole right anterior thigh, nonpigmented  (a) benign biopsy 2018, with resolution   PLAN: Priyah is now 5-1/2 years out from definitive surgery for her breast cancer with no evidence of disease recurrence.  This is very favorable.  She is completing 5 years of tamoxifen today.  We discussed the fact that this is totally standard as far as her breast cancer is concerned in addition to reducing the risk of this cancer recurring she has cut in half her risk of another breast cancer developing in the future  She has several options at this point.  Wanted [place] to switch to anastrozole for 2 years.  This would make the vaginal dryness issue considerably worse and would be unlikely to make any significant difference in her already excellent prognosis.  Accordingly that is not recommended.  She could continue tamoxifen an additional 5 years.  This might minimally further reduce her risk of recurrence, perhaps around the 2% mark, but more importantly it would allow her to safely use vaginal estrogens if she wished to for reasons General pelvic health.  A third option is to "get out of the cancer business", stop tamoxifen and return to follow-up through her primary care physicians.  This last is her choice and I think it is entirely reasonable.  Her risk of recurrence of her right now is going to be less than 5%.  The only real reason to consider tamoxifen was the vaginal dryness and dyspareunia issue and she is managing that well with lubricants.  We did mention to other options.  1 was participation in our pelvic rehab program and the other 1 was consideration of the [facility] touch.  At this point she does not want a follow-up on either of those but she could decide to do that a year or 2 from now if she wishes and will let us know if she decides to.  Accordingly at this point we are sending  her back to primary care.  All she will need in terms of breast cancer follow-up is her yearly mammography and a yearly physician breast exam.  We will be glad to see Latoya again at any point in the future if and when the need arises but as of now are making no further routine appointments for her here.  Total encounter time 30 minutes.*  Total encounter time 25 minutes.*  Brylen Wagar, Virgie Dad, MD  09/28/20 2:48 PM Medical Oncology and Hematology [facility] [place], [place] [address] Tel. [phone]    Fax. [phone]   I, Wilburn Mylar, am acting as scribe for Dr. Virgie Dad. Anureet Bruington.  I, Lurline Del MD, have reviewed the above documentation for accuracy and completeness, and I agree with the above.    *Total Encounter Time as defined by the Centers for Medicare and Medicaid Services includes, in addition to the face-to-face time of a patient visit (documented in the note above) non-face-to-face time: obtaining and reviewing outside history, ordering and reviewing medications, tests or procedures, care coordination (communications with other health care professionals or caregivers) and documentation in the medical record.

## 2021-06-09 ENCOUNTER — Other Ambulatory Visit: Payer: Self-pay | Admitting: Obstetrics and Gynecology

## 2021-06-09 ENCOUNTER — Other Ambulatory Visit: Payer: Self-pay | Admitting: Emergency Medicine

## 2021-06-09 DIAGNOSIS — Z1231 Encounter for screening mammogram for malignant neoplasm of breast: Secondary | ICD-10-CM

## 2021-07-26 ENCOUNTER — Ambulatory Visit
Admission: RE | Admit: 2021-07-26 | Discharge: 2021-07-26 | Disposition: A | Discharge status: Home / Self Care | Payer: BC Managed Care – PPO | Source: Ambulatory Visit | Attending: Obstetrics and Gynecology | Admitting: Obstetrics and Gynecology

## 2021-07-26 DIAGNOSIS — Z1231 Encounter for screening mammogram for malignant neoplasm of breast: Secondary | ICD-10-CM

## 2022-03-06 LAB — LAB REPORT - SCANNED: EGFR: 60

## 2022-06-12 ENCOUNTER — Other Ambulatory Visit: Payer: Self-pay | Admitting: Obstetrics and Gynecology

## 2022-06-12 DIAGNOSIS — Z1231 Encounter for screening mammogram for malignant neoplasm of breast: Secondary | ICD-10-CM

## 2022-08-01 ENCOUNTER — Ambulatory Visit
Admission: RE | Admit: 2022-08-01 | Discharge: 2022-08-01 | Disposition: A | Discharge status: Home / Self Care | Payer: BC Managed Care – PPO | Source: Ambulatory Visit | Attending: Obstetrics and Gynecology | Admitting: Obstetrics and Gynecology

## 2022-08-01 DIAGNOSIS — Z1231 Encounter for screening mammogram for malignant neoplasm of breast: Secondary | ICD-10-CM

## 2022-08-29 NOTE — Progress Notes (Deleted)
Office Visit Note  Patient: Renee Wright             Date of Birth: [birth year]           MRN: [id number]             PCP: Marylen Ponto, MD Referring: Charlett Nose, MD Visit Date: 09/12/2022 Occupation: @GUAROCC @  Subjective:  Discuss treatment options   History of Present Illness: Renee Wright is a 52 y.o. female who presents today for a new patient consultation as requested by PCP.  Patient has a history of osteoporosis and presents today to discuss treatment options.     Activities of Daily Living:  Patient reports morning stiffness for *** {minute/hour:19697}.   Patient {ACTIONS;DENIES/REPORTS:21021675::"Denies"} nocturnal pain.  Difficulty dressing/grooming: {ACTIONS;DENIES/REPORTS:21021675::"Denies"} Difficulty climbing stairs: {ACTIONS;DENIES/REPORTS:21021675::"Denies"} Difficulty getting out of chair: {ACTIONS;DENIES/REPORTS:21021675::"Denies"} Difficulty using hands for taps, buttons, cutlery, and/or writing: {ACTIONS;DENIES/REPORTS:21021675::"Denies"}  No Rheumatology ROS completed.   PMFS History:  Patient Active Problem List   Diagnosis Date Noted  . Genetic testing 04/14/2015  . Family history of breast cancer   . Family history of uterine cancer   . Family history of melanoma   . Family history of colon cancer   . Malignant neoplasm of upper-inner quadrant of right breast in female, estrogen receptor positive (HCC) 03/26/2015  . Parasomnia, organic 10/19/2014  . Nightmares REM-sleep type 10/19/2014  . Irritable bowel syndrome with both constipation and diarrhea 10/19/2014  . Breast cyst 01/13/2011    Past Medical History:  Diagnosis Date  . Breast calcification, right 01/2011  . Breast cancer (HCC)   . Breast cancer of upper-inner quadrant of right female breast (HCC) 03/26/2015  . Breast cyst 01/13/2011   Right breast at 7:30 areolar edge   . Family history of breast cancer   . Family history of colon cancer   . Family history of  melanoma   . Family history of uterine cancer   . IBS (irritable bowel syndrome)   . Personal history of radiation therapy 2017  . PONV (postoperative nausea and vomiting)     Family History  Problem Relation Age of Onset  . Breast cancer Maternal Aunt 62  . Uterine cancer Mother 23  . Melanoma Maternal Grandmother 75       mets to bladder  . Colon cancer Paternal Grandmother        dx over 27   Past Surgical History:  Procedure Laterality Date  . BREAST BIOPSY  10/1991  . BREAST BIOPSY  02/02/2011  . BREAST BIOPSY Right 03/20/2015  . BREAST EXCISIONAL BIOPSY Right 02/02/2011  . BREAST LUMPECTOMY Right 05/27/2015  . BREAST LUMPECTOMY WITH RADIOACTIVE SEED AND SENTINEL LYMPH NODE BIOPSY Right 05/27/2015   Procedure: BREAST LUMPECTOMY WITH RADIOACTIVE SEED AND SENTINEL LYMPH NODE BIOPSY;  Surgeon: Harriette Bouillon, MD;  Location: [facility];  Service: General;  Laterality: Right;  . CESAREAN SECTION  05/29/96, 03/30/00   Social History   Social History Narrative   Married, 2 children.      Drinks one cup of caffeine daily.   Immunization History  Administered Date(s) Administered  . Influenza-Unspecified 10/09/2017     Objective: Vital Signs: There were no vitals taken for this visit.   Physical Exam Vitals and nursing note reviewed.  Constitutional:      Appearance: She is well-developed.  HENT:     Head: Normocephalic and atraumatic.  Eyes:     Conjunctiva/sclera: Conjunctivae normal.  Cardiovascular:  Rate and Rhythm: Normal rate and regular rhythm.     Heart sounds: Normal heart sounds.  Pulmonary:     Effort: Pulmonary effort is normal.     Breath sounds: Normal breath sounds.  Abdominal:     General: Bowel sounds are normal.     Palpations: Abdomen is soft.  Musculoskeletal:     Cervical back: Normal range of motion.  Lymphadenopathy:     Cervical: No cervical adenopathy.  Skin:    General: Skin is warm and dry.     Capillary Refill:  Capillary refill takes less than 2 seconds.  Neurological:     Mental Status: She is alert and oriented to person, place, and time.  Psychiatric:        Behavior: Behavior normal.     Musculoskeletal Exam: ***  CDAI Exam: CDAI Score: -- Patient Global: --; Provider Global: -- Swollen: --; Tender: -- Joint Exam 09/12/2022   No joint exam has been documented for this visit   There is currently no information documented on the homunculus. Go to the Rheumatology activity and complete the homunculus joint exam.  Investigation: No additional findings.  Imaging: MM 3D SCREENING MAMMOGRAM BILATERAL BREAST  Result Date: 08/02/2022 CLINICAL DATA:  Screening. EXAM: DIGITAL SCREENING BILATERAL MAMMOGRAM WITH TOMOSYNTHESIS AND CAD TECHNIQUE: Bilateral screening digital craniocaudal and mediolateral oblique mammograms were obtained. Bilateral screening digital breast tomosynthesis was performed. The images were evaluated with computer-aided detection. COMPARISON:  Previous exam(s). ACR Breast Density Category c: The breasts are heterogeneously dense, which may obscure small masses. FINDINGS: There are no findings suspicious for malignancy. IMPRESSION: No mammographic evidence of malignancy. A result letter of this screening mammogram will be mailed directly to the patient. RECOMMENDATION: Screening mammogram in one year. (Code:SM-B-01Y) BI-RADS CATEGORY  1: Negative. Electronically Signed   By: Edwin Cap M.D.   On: 08/02/2022 15:37    Recent Labs: Lab Results  Component Value Date   WBC 5.4 09/28/2020   HGB 12.6 09/28/2020   PLT 208 09/28/2020   NA 141 09/28/2020   K 3.7 09/28/2020   CL 108 09/28/2020   CO2 26 09/28/2020   GLUCOSE 90 09/28/2020   BUN 11 09/28/2020   CREATININE 0.82 09/28/2020   BILITOT 0.5 09/28/2020   ALKPHOS 58 09/28/2020   AST 32 09/28/2020   ALT 8 09/28/2020   PROT 6.6 09/28/2020   ALBUMIN 3.8 09/28/2020   CALCIUM 9.3 09/28/2020   GFRAA >60 09/29/2019     Speciality Comments: No specialty comments available.  Procedures:  No procedures performed Allergies: Patient has no known allergies.   Assessment / Plan:     Visit Diagnoses: Age-related osteoporosis without current pathological fracture - Intolerance to fosamax. 03/06/22: DEXA LFN BMD 0.498 T-score -3.2  Malignant neoplasm of upper-inner quadrant of right breast in female, estrogen receptor positive (HCC)  Nightmares REM-sleep type  Parasomnia, organic  Irritable bowel syndrome with both constipation and diarrhea  Family history of breast cancer  Family history of colon cancer  Family history of melanoma  Family history of uterine cancer  Orders: No orders of the defined types were placed in this encounter.  No orders of the defined types were placed in this encounter.   Face-to-face time spent with patient was *** minutes. Greater than 50% of time was spent in counseling and coordination of care.  Follow-Up Instructions: No follow-ups on file.   Gearldine Bienenstock, PA-C  Note - This record has been created using Dragon software.  Chart creation  errors have been sought, but may not always  have been located. Such creation errors do not reflect on  the standard of medical care.

## 2022-09-12 ENCOUNTER — Encounter: Payer: BC Managed Care – PPO | Admitting: Physician Assistant

## 2022-09-12 DIAGNOSIS — K582 Mixed irritable bowel syndrome: Secondary | ICD-10-CM

## 2022-09-12 DIAGNOSIS — Z808 Family history of malignant neoplasm of other organs or systems: Secondary | ICD-10-CM

## 2022-09-12 DIAGNOSIS — Z8049 Family history of malignant neoplasm of other genital organs: Secondary | ICD-10-CM

## 2022-09-12 DIAGNOSIS — Z800 Family history of malignant neoplasm of digestive organs: Secondary | ICD-10-CM

## 2022-09-12 DIAGNOSIS — M810 Age-related osteoporosis without current pathological fracture: Secondary | ICD-10-CM

## 2022-09-12 DIAGNOSIS — G4750 Parasomnia, unspecified: Secondary | ICD-10-CM

## 2022-09-12 DIAGNOSIS — F515 Nightmare disorder: Secondary | ICD-10-CM

## 2022-09-12 DIAGNOSIS — Z803 Family history of malignant neoplasm of breast: Secondary | ICD-10-CM

## 2022-09-12 DIAGNOSIS — Z170 Estrogen receptor positive status [ER+]: Secondary | ICD-10-CM

## 2022-11-21 NOTE — Progress Notes (Unsigned)
Office Visit Note  Patient: Renee Wright             Date of Birth: 1970/03/02           MRN: [id number]             PCP: Marylen Ponto, MD Referring: Charlett Nose, MD Visit Date: 12/05/2022 Occupation: @GUAROCC @  Subjective:  Discuss treatment options   History of Present Illness: Renee Wright is a 52 y.o. female who presents today for a new patient consultation.  She presents today to discuss DEXA results and osteoporosis treatment options.  Patient had her first bone density performed on 03/06/2022 and was diagnosed with osteoporosis.  She was initiated on Fosamax 70 mg once weekly.  She took Fosamax for 5 to 6 weeks but discontinued due to significant GI intolerance.  Patient states that she was also diagnosed with vitamin D deficiency.  She has been trying to get sun exposure 10 to 15 minutes/day.  She is also been taking a calcium supplement.  Patient states that she performs strengthening exercises 3 days a week.  She denies any history of fractures. Patient states that she was diagnosed with breast cancer in 2017.  She underwent a lumpectomy as well as radiation therapy.  She did not require chemotherapy.  She denies any long-term prednisone use.    Activities of Daily Living:  Patient reports morning stiffness for 0 minutes.   Patient Denies nocturnal pain.  Difficulty dressing/grooming: Denies Difficulty climbing stairs: Denies Difficulty getting out of chair: Denies Difficulty using hands for taps, buttons, cutlery, and/or writing: Denies  Review of Systems  Constitutional:  Negative for fatigue.  HENT:  Negative for mouth sores and mouth dryness.   Eyes:  Negative for pain, visual disturbance and dryness.  Respiratory:  Negative for cough, shortness of breath and wheezing.   Cardiovascular:  Negative for chest pain and palpitations.  Gastrointestinal:  Negative for blood in stool, constipation and diarrhea.  Endocrine: Negative for increased urination.   Genitourinary:  Negative for involuntary urination.  Musculoskeletal:  Negative for joint pain, gait problem, joint pain, joint swelling, myalgias, muscle weakness, morning stiffness, muscle tenderness and myalgias.  Skin:  Negative for color change, rash, hair loss and sensitivity to sunlight.  Allergic/Immunologic: Negative for susceptible to infections.  Neurological:  Negative for dizziness and headaches.  Hematological:  Negative for swollen glands.  Psychiatric/Behavioral:  Negative for depressed mood and sleep disturbance. The patient is not nervous/anxious.     PMFS History:  Patient Active Problem List   Diagnosis Date Noted   Genetic testing 04/14/2015   Family history of breast cancer    Family history of uterine cancer    Family history of melanoma    Family history of colon cancer    Malignant neoplasm of upper-inner quadrant of right breast in female, estrogen receptor positive (HCC) 03/26/2015   Parasomnia, organic 10/19/2014   Nightmares REM-sleep type 10/19/2014   Irritable bowel syndrome with both constipation and diarrhea 10/19/2014   Breast cyst 01/13/2011    Past Medical History:  Diagnosis Date   Breast calcification, right 01/2011   Breast cancer (HCC)    Breast cancer of upper-inner quadrant of right female breast (HCC) 03/26/2015   Breast cyst 01/13/2011   Right breast at 7:30 areolar edge    Family history of breast cancer    Family history of colon cancer    Family history of melanoma    Family history of uterine  cancer    IBS (irritable bowel syndrome)    Personal history of radiation therapy 2017   PONV (postoperative nausea and vomiting)     Family History  Problem Relation Age of Onset   Uterine cancer Mother 107   Atrial fibrillation Mother    Kidney Stones Mother    Osteoporosis Mother    Hydrocephalus Brother    Breast cancer Maternal Aunt 60   Melanoma Maternal Grandmother 75       mets to bladder   Colon cancer Paternal Grandmother         dx over 61   Healthy Daughter    Crohn's disease Daughter    Healthy Daughter    Past Surgical History:  Procedure Laterality Date   BREAST BIOPSY Left 10/1991   BREAST BIOPSY  02/02/2011   BREAST BIOPSY Right 03/20/2015   BREAST EXCISIONAL BIOPSY Right 02/02/2011   BREAST LUMPECTOMY Right 05/27/2015   BREAST LUMPECTOMY WITH RADIOACTIVE SEED AND SENTINEL LYMPH NODE BIOPSY Right 05/27/2015   Procedure: BREAST LUMPECTOMY WITH RADIOACTIVE SEED AND SENTINEL LYMPH NODE BIOPSY;  Surgeon: Harriette Bouillon, MD;  Location: [facility];  Service: General;  Laterality: Right;   CESAREAN SECTION  05/29/96, 03/30/00   Social History   Social History Narrative   Married, 2 children.      Drinks one cup of caffeine daily.   Immunization History  Administered Date(s) Administered   Influenza-Unspecified 10/09/2017     Objective: Vital Signs: BP 114/74 (BP Location: Left Arm, Patient Position: Sitting, Cuff Size: Normal)   Pulse 69   Resp 15   Ht 5\' 5"  (1.651 m)   Wt 139 lb 9.6 oz (63.3 kg)   BMI 23.23 kg/m    Physical Exam Vitals and nursing note reviewed.  Constitutional:      Appearance: She is well-developed.  HENT:     Head: Normocephalic and atraumatic.  Eyes:     Conjunctiva/sclera: Conjunctivae normal.  Cardiovascular:     Rate and Rhythm: Normal rate and regular rhythm.     Heart sounds: Normal heart sounds.  Pulmonary:     Effort: Pulmonary effort is normal.     Breath sounds: Normal breath sounds.  Abdominal:     General: Bowel sounds are normal.     Palpations: Abdomen is soft.  Musculoskeletal:     Cervical back: Normal range of motion.  Lymphadenopathy:     Cervical: No cervical adenopathy.  Skin:    General: Skin is warm and dry.     Capillary Refill: Capillary refill takes less than 2 seconds.  Neurological:     Mental Status: She is alert and oriented to person, place, and time.  Psychiatric:        Behavior: Behavior normal.       Musculoskeletal Exam: C-spine, thoracic spine, and lumbar spine good ROM.  Shoulder joints, elbow joints, wrist joints, Mcps, PIPs, and DIPs good ROM with no synovitis.  Complete fist formation bilaterally. Hip joints have good ROM with no discomfort.  Knee joints have good ROM with no warmth or effusion.  Ankle joints have good ROM with no tenderness or joint swelling.    CDAI Exam: CDAI Score: -- Patient Global: --; Provider Global: -- Swollen: --; Tender: -- Joint Exam 12/05/2022   No joint exam has been documented for this visit   There is currently no information documented on the homunculus. Go to the Rheumatology activity and complete the homunculus joint exam.  Investigation: No additional findings.  Imaging: No results found.  Recent Labs: Lab Results  Component Value Date   WBC 5.4 09/28/2020   HGB 12.6 09/28/2020   PLT 208 09/28/2020   NA 141 09/28/2020   K 3.7 09/28/2020   CL 108 09/28/2020   CO2 26 09/28/2020   GLUCOSE 90 09/28/2020   BUN 11 09/28/2020   CREATININE 0.82 09/28/2020   BILITOT 0.5 09/28/2020   ALKPHOS 58 09/28/2020   AST 32 09/28/2020   ALT 8 09/28/2020   PROT 6.6 09/28/2020   ALBUMIN 3.8 09/28/2020   CALCIUM 9.3 09/28/2020   GFRAA >60 09/29/2019    Speciality Comments: No specialty comments available.  Procedures:  No procedures performed Allergies: Patient has no known allergies.   Assessment / Plan:     Visit Diagnoses: Age-related osteoporosis without current pathological fracture - 03/06/22: LFN BMD 0.498 T-score -3.2, Lumbar spine T-score -3.0--first DEXA. FSH and estradiol level May 2019 consistent with menopause.  History of breast cancer-required lumpectomy and radiation therapy-completed in August 2017.  No history of long-term prednisone use.  No history of fractures. Patient has been strength training 3 days a week and taking a daily calcium and collagen supplement. Previous therapy: Fosamax started spring 2024-treated for 5  to 6 weeks-discontinued due to GI intolerance. Different treatment options were discussed today in detail.  Indications, contraindications, and potential side effects of Evenity were reviewed today.  All questions were addressed.  Plan to apply for monthly Evenity injections through her insurance.  She will undergoing treatment with evenity x12 month and then will likely transition to an anti-resorptive agent.  Lab work from 03/06/2022 was reviewed today in the office:CBC  WNL the normal limits, CMP WNL: Calcium 9.1, iron 78, vitamin D 23. Plan to obtain the following lab work today.  She will follow up in the office in 2 months or sooner if needed.    - Plan: Parathyroid hormone, intact (no Ca), VITAMIN D 25 Hydroxy (Vit-D Deficiency, Fractures), Phosphorus, TSH, Serum protein electrophoresis with reflex, CBC with Differential/Platelet, COMPLETE METABOLIC PANEL WITH GFR  Counseled patient on purpose, proper use, and adverse effects of Evenity.  Counseled patient that Sheilah Mins is a medication that must be injected subcutaneously once monthly as two divided injections for 12 months by a healthcare professional.  Advised patient to take calcium 1200 mg daily and vitamin D 1000 units daily.  Reviewed the most common adverse effects of Evenity including headache, osteonecrosis of the jaw, and muscle/bone pain. Reviewed importance of good dental hygiene.  Patient confirms they do not have any major dental work planned at this time.   Reviewed that Sheilah Mins has FDA boxed warning for major adverse cardiovascular events - Evenity may increase the risk of myocardial infarction, stroke, and cardiovascular death. Confirmed that patient has not had MI or stroke within the preceding year.  Reviewed with patient the signs/symptoms of low calcium and advised patient to alert [place] if they experience these symptoms.  Provided patient with medication education material and answered all questions. Written consent form signed by  patient. Will apply for Evenity through patient's insurance.  Medication monitoring encounter - Plan to apply for Evenity 210 mg monthly sq injections x12 months through her insurance.  Plan to obtain the following lab work today prior to initiating therapy.  Plan: Parathyroid hormone, intact (no Ca), VITAMIN D 25 Hydroxy (Vit-D Deficiency, Fractures), Phosphorus, TSH, Serum protein electrophoresis with reflex, CBC with Differential/Platelet, COMPLETE METABOLIC PANEL WITH GFR  Other fatigue -Plan to obtain the following  lab work today.   Plan: Parathyroid hormone, intact (no Ca), VITAMIN D 25 Hydroxy (Vit-D Deficiency, Fractures), Phosphorus, TSH, Serum protein electrophoresis with reflex, CBC with Differential/Platelet, COMPLETE METABOLIC PANEL WITH GFR  Vitamin D deficiency - Vitamin D was low-23 on 03/06/22. Plan to check the following lab work today.  Plan: Parathyroid hormone, intact (no Ca), VITAMIN D 25 Hydroxy (Vit-D Deficiency, Fractures), Phosphorus, TSH  Malignant neoplasm of upper-inner quadrant of right breast in female, estrogen receptor positive (HCC) - Diagnosed in 2017. Previously under the care of Dr. Darnelle Catalan. Underwent radiation therapy and lumpectomy. Completed 5 years of tamoxifen.  Plan to avoid Forteo/tymlos.   Other medical conditions are listed as follows:   Sarcoma of uterus (HCC)  Nightmares REM-sleep type  Irritable bowel syndrome with both constipation and diarrhea  Family history of breast cancer  Family history of melanoma  Family history of colon cancer  Family history of uterine cancer  Parasomnia, organic  History of herpes zoster   Orders: Orders Placed This Encounter  Procedures   Parathyroid hormone, intact (no Ca)   VITAMIN D 25 Hydroxy (Vit-D Deficiency, Fractures)   Phosphorus   TSH   Serum protein electrophoresis with reflex   CBC with Differential/Platelet   COMPLETE METABOLIC PANEL WITH GFR   No orders of the defined types were  placed in this encounter.    Follow-Up Instructions: Return in about 2 months (around 02/04/2023) for Osteoporosis-[facility].   Gearldine Bienenstock, PA-C  Note - This record has been created using Dragon software.  Chart creation errors have been sought, but may not always  have been located. Such creation errors do not reflect on  the standard of medical care.

## 2022-11-30 ENCOUNTER — Encounter: Payer: BC Managed Care – PPO | Admitting: Physician Assistant

## 2022-12-05 ENCOUNTER — Ambulatory Visit: Payer: BC Managed Care – PPO | Attending: Physician Assistant | Admitting: Physician Assistant

## 2022-12-05 ENCOUNTER — Telehealth: Payer: Self-pay | Admitting: Pharmacist

## 2022-12-05 ENCOUNTER — Encounter: Payer: Self-pay | Admitting: Physician Assistant

## 2022-12-05 VITALS — BP 114/74 | HR 69 | Resp 15 | Ht 65.0 in | Wt 139.6 lb

## 2022-12-05 DIAGNOSIS — M810 Age-related osteoporosis without current pathological fracture: Secondary | ICD-10-CM | POA: Diagnosis not present

## 2022-12-05 DIAGNOSIS — R5383 Other fatigue: Secondary | ICD-10-CM

## 2022-12-05 DIAGNOSIS — G4750 Parasomnia, unspecified: Secondary | ICD-10-CM

## 2022-12-05 DIAGNOSIS — Z800 Family history of malignant neoplasm of digestive organs: Secondary | ICD-10-CM

## 2022-12-05 DIAGNOSIS — C549 Malignant neoplasm of corpus uteri, unspecified: Secondary | ICD-10-CM

## 2022-12-05 DIAGNOSIS — Z8619 Personal history of other infectious and parasitic diseases: Secondary | ICD-10-CM

## 2022-12-05 DIAGNOSIS — C50211 Malignant neoplasm of upper-inner quadrant of right female breast: Secondary | ICD-10-CM

## 2022-12-05 DIAGNOSIS — Z803 Family history of malignant neoplasm of breast: Secondary | ICD-10-CM

## 2022-12-05 DIAGNOSIS — K582 Mixed irritable bowel syndrome: Secondary | ICD-10-CM

## 2022-12-05 DIAGNOSIS — Z170 Estrogen receptor positive status [ER+]: Secondary | ICD-10-CM

## 2022-12-05 DIAGNOSIS — Z808 Family history of malignant neoplasm of other organs or systems: Secondary | ICD-10-CM

## 2022-12-05 DIAGNOSIS — Z8049 Family history of malignant neoplasm of other genital organs: Secondary | ICD-10-CM

## 2022-12-05 DIAGNOSIS — Z5181 Encounter for therapeutic drug level monitoring: Secondary | ICD-10-CM

## 2022-12-05 DIAGNOSIS — F515 Nightmare disorder: Secondary | ICD-10-CM

## 2022-12-05 DIAGNOSIS — E559 Vitamin D deficiency, unspecified: Secondary | ICD-10-CM

## 2022-12-05 NOTE — Progress Notes (Signed)
Pharmacy Note  Subjective:  Patient presents today to [facility] for follow up office visit.   Patient was seen by the pharmacist for counseling on Evenity.   History of radiation therapy and lumpectomy for her breast cancer. Has strong family history of cancer.   Failed alendronate due to GI intolerance  History of cardiovascular events in the past 12 months? none  Objective: CMP     Component Value Date/Time   NA 141 09/28/2020 1442   NA 141 05/09/2016 0922   K 3.7 09/28/2020 1442   K 4.2 05/09/2016 0922   CL 108 09/28/2020 1442   CO2 26 09/28/2020 1442   CO2 27 05/09/2016 0922   GLUCOSE 90 09/28/2020 1442   GLUCOSE 88 05/09/2016 0922   BUN 11 09/28/2020 1442   BUN 9.7 05/09/2016 0922   CREATININE 0.82 09/28/2020 1442   CREATININE 0.8 05/09/2016 0922   CALCIUM 9.3 09/28/2020 1442   CALCIUM 9.2 05/09/2016 0922   PROT 6.6 09/28/2020 1442   PROT 6.4 05/09/2016 0922   ALBUMIN 3.8 09/28/2020 1442   ALBUMIN 3.6 05/09/2016 0922   AST 32 09/28/2020 1442   AST 34 05/09/2016 0922   ALT 8 09/28/2020 1442   ALT 10 05/09/2016 0922   ALKPHOS 58 09/28/2020 1442   ALKPHOS 74 05/09/2016 0922   BILITOT 0.5 09/28/2020 1442   BILITOT 0.93 05/09/2016 0922   GFRNONAA >60 09/28/2020 1442   GFRAA >60 09/29/2019 1444    DEXA on 03/06/22: LFN BMD 0.498 T-score -3.2, Lumbar spine T-score -3.0--first DEXA .  Assessment/Plan:  Counseled patient on purpose, proper use, and adverse effects of Evenity.  Counseled patient that Sheilah Mins is a medication that must be injected once monthly for 12 months by a healthcare professional.  Advised patient to take calcium 1200 mg daily and vitamin D 800 units daily.  Reviewed the most common adverse effects of Evenity including headache, osteonecrosis of the jaw, and muscle/bone pain.  Reviewed that Sheilah Mins has FDA boxed warning for major adverse cardiovascular events. Reviewed that Evenity may increase the risk of myocardial infarction, stroke,  and cardiovascular death. Confirmed that patient has not had MI or stroke within the preceding year.  Patient confirms she does not have any major dental work planned at this time.  Reviewed with patient the signs/symptoms of low calcium and advised patient to alert [place] if she experiences these symptoms.  Provided patient with medication education material and answered all questions. Will apply for Evenity through patient's insurance. Referall will be placed at [facility]  Chesley Mires, PharmD, MPH, BCPS, CPP Clinical Pharmacist (Rheumatology and Pulmonology)

## 2022-12-05 NOTE — Telephone Encounter (Signed)
Pending OV note and baseline labs, referral for Evenity will need to be placed at [facility]  Chesley Mires, PharmD, MPH, BCPS, CPP Clinical Pharmacist (Rheumatology and Pulmonology)

## 2022-12-05 NOTE — Patient Instructions (Addendum)
Referral to [facility] [address] [facility] will be placed  Romosozumab Injection What is this medication? ROMOSOZUMAB (roe moe SOZ ue mab) prevents and treats osteoporosis. It works by Interior and spatial designer stronger and less likely to break (fracture). It is a monoclonal antibody. This medicine may be used for other purposes; ask your health care provider or pharmacist if you have questions. COMMON BRAND NAME(S): EVENITY What should I tell my care team before I take this medication? They need to know if you have any of these conditions: Dental disease Heart attack Heart disease Kidney problems Low levels of calcium in the blood On dialysis Stroke Wear dentures An unusual or allergic reaction to romosozumab, other medications, foods, dyes or preservatives Pregnant or trying to get pregnant Breast-feeding How should I use this medication? This medication is injected under the skin. It is given by your care team in a hospital or clinic setting. A special MedGuide will be given to you by the pharmacist with each prescription and refill. Be sure to read this information carefully each time. Talk to your care team about the use of this medication in children. Special care may be needed. Overdosage: If you think you have taken too much of this medicine contact a poison control center or emergency room at once. NOTE: This medicine is only for you. Do not share this medicine with others. What if I miss a dose? Keep appointments for follow-up doses. It is important not to miss your dose. Call your care team if you are unable to keep an appointment. What may interact with this medication? Interactions are not expected. This list may not describe all possible interactions. Give your health care provider a list of all the medicines, herbs, non-prescription drugs, or dietary supplements you use. Also tell them if you smoke, drink alcohol, or use illegal drugs. Some items may interact with your  medicine. What should I watch for while using this medication? Your condition will be monitored carefully while you are receiving this medication. You may need bloodwork while taking this medication. You should make sure you get enough calcium and vitamin D while you are taking this medication. Discuss the foods you eat and the vitamins you take with your care team. Some people who take this medication have severe bone, joint, or muscle pain. This medication may also increase your risk for jaw problems or a broken thigh bone. Tell your care team right away if you have severe pain in your jaw, bones, joints, or muscles. Tell you care team if you have any pain that does not go away or that gets worse. Tell your dentist and dental surgeon that you are taking this medication. You should not have major dental surgery while on this medication. See your dentist to have a dental exam and fix any dental problems before starting this medication. Take good care of your teeth while on this medication. Make sure you see your dentist for regular follow-up appointments. What side effects may I notice from receiving this medication? Side effects that you should report to your care team as soon as possible: Allergic reactions or angioedema--skin rash, itching or hives, swelling of the face, eyes, lips, tongue, arms, or legs, trouble swallowing or breathing Heart attack--pain or tightness in the chest, shoulders, arms, or jaw, nausea, shortness of breath, cold or clammy skin, feeling faint or lightheaded Low calcium level--muscle pain or cramps, confusion, tingling, or numbness in the hands or feet Osteonecrosis of the jaw--pain, swelling, or  redness in the mouth, numbness of the jaw, poor healing after dental work, unusual discharge from the mouth, visible bones in the mouth Severe bone, joint, or muscle pain Stroke--sudden numbness or weakness of the face, arm, or leg, trouble speaking, confusion, trouble walking,  loss of balance or coordination, dizziness, severe headache, change in vision Side effects that usually do not require medical attention (report to your care team if they continue or are bothersome): Headache Joint pain Muscle spasms Pain, redness, or irritation at injection site Swelling of the ankles, hands, or feet This list may not describe all possible side effects. Call your doctor for medical advice about side effects. You may report side effects to FDA at 1-800-FDA-1088. Where should I keep my medication? This medication is given in a hospital or clinic. It will not be stored at home. NOTE: This sheet is a summary. It may not cover all possible information. If you have questions about this medicine, talk to your doctor, pharmacist, or health care provider.  2024 Elsevier/Gold Standard (2021-01-26 00:00:00)

## 2022-12-11 LAB — CBC WITH DIFFERENTIAL/PLATELET
Absolute Lymphocytes: 1555 {cells}/uL (ref 850–3900)
Absolute Monocytes: 518 {cells}/uL (ref 200–950)
Basophils Absolute: 32 {cells}/uL (ref 0–200)
Basophils Relative: 0.6 %
Eosinophils Absolute: 108 {cells}/uL (ref 15–500)
Eosinophils Relative: 2 %
HCT: 42.9 % (ref 35.0–45.0)
Hemoglobin: 13.9 g/dL (ref 11.7–15.5)
MCH: 28.3 pg (ref 27.0–33.0)
MCHC: 32.4 g/dL (ref 32.0–36.0)
MCV: 87.2 fL (ref 80.0–100.0)
MPV: 10.7 fL (ref 7.5–12.5)
Monocytes Relative: 9.6 %
Neutro Abs: 3186 {cells}/uL (ref 1500–7800)
Neutrophils Relative %: 59 %
Platelets: 262 10*3/uL (ref 140–400)
RBC: 4.92 10*6/uL (ref 3.80–5.10)
RDW: 12.8 % (ref 11.0–15.0)
Total Lymphocyte: 28.8 %
WBC: 5.4 10*3/uL (ref 3.8–10.8)

## 2022-12-11 LAB — VITAMIN D 25 HYDROXY (VIT D DEFICIENCY, FRACTURES): Vit D, 25-Hydroxy: 34 ng/mL (ref 30–100)

## 2022-12-11 LAB — PROTEIN ELECTROPHORESIS, SERUM, WITH REFLEX
Albumin ELP: 4.1 g/dL (ref 3.8–4.8)
Alpha 1: 0.3 g/dL (ref 0.2–0.3)
Alpha 2: 0.7 g/dL (ref 0.5–0.9)
Beta 2: 0.4 g/dL (ref 0.2–0.5)
Beta Globulin: 0.5 g/dL (ref 0.4–0.6)
Gamma Globulin: 0.9 g/dL (ref 0.8–1.7)
Total Protein: 6.8 g/dL (ref 6.1–8.1)

## 2022-12-11 LAB — COMPLETE METABOLIC PANEL WITH GFR
AG Ratio: 1.8 (calc) (ref 1.0–2.5)
ALT: 11 U/L (ref 6–29)
AST: 37 U/L — ABNORMAL HIGH (ref 10–35)
Albumin: 4.4 g/dL (ref 3.6–5.1)
Alkaline phosphatase (APISO): 74 U/L (ref 37–153)
BUN: 14 mg/dL (ref 7–25)
CO2: 28 mmol/L (ref 20–32)
Calcium: 9.6 mg/dL (ref 8.6–10.4)
Chloride: 104 mmol/L (ref 98–110)
Creat: 0.9 mg/dL (ref 0.50–1.03)
Globulin: 2.4 g/dL (ref 1.9–3.7)
Glucose, Bld: 94 mg/dL (ref 65–99)
Potassium: 4.6 mmol/L (ref 3.5–5.3)
Sodium: 139 mmol/L (ref 135–146)
Total Bilirubin: 0.5 mg/dL (ref 0.2–1.2)
Total Protein: 6.8 g/dL (ref 6.1–8.1)
eGFR: 76 mL/min/{1.73_m2} (ref >=60)

## 2022-12-11 LAB — PHOSPHORUS: Phosphorus: 3 mg/dL (ref 2.5–4.5)

## 2022-12-11 LAB — TSH: TSH: 1.87 m[IU]/L

## 2022-12-11 LAB — PARATHYROID HORMONE, INTACT (NO CA): PTH: 53 pg/mL (ref 16–77)

## 2022-12-11 NOTE — Progress Notes (Signed)
CBC WNL  AST borderline elevated. ALT WNL. Rest of CMP WNL  Vitamin D WNL  TSH WNL Phosphorus Wnl  SPEP did not reveal any abnormal protein bands. PTH WNL

## 2022-12-13 ENCOUNTER — Other Ambulatory Visit: Payer: Self-pay | Admitting: Pharmacist

## 2022-12-13 DIAGNOSIS — M810 Age-related osteoporosis without current pathological fracture: Secondary | ICD-10-CM | POA: Insufficient documentation

## 2022-12-13 NOTE — Progress Notes (Signed)
Therapy plan placed for Evenity SQ [id number]) for [facility] to start benefits investigation  Diagnosis: age-related osteoporosis  Provider: Dr. Corliss Skains and Sherron Ales, PA-C  Dose: 210mg  SQ monthly x  12 months  Last Clinic Visit: 12/05/22 Next Clinic Visit: 02/05/23  Pertinent Labs: CMP wnl, TSH, SPEP, phosphorus wnl on 12/05/22  Chesley Mires, PharmD, MPH, BCPS, CPP Clinical Pharmacist (Rheumatology and Pulmonology)

## 2022-12-13 NOTE — Telephone Encounter (Signed)
Order for [employer] placed at [facility]

## 2022-12-14 ENCOUNTER — Telehealth: Payer: Self-pay | Admitting: Pharmacy Technician

## 2022-12-14 NOTE — Telephone Encounter (Signed)
FYI NOTE:  Patient will be scheduled as soon as possible  Patient will be scheduled as soon as possible.  Auth Submission: APPROVED Site of care: Site of care: [facility] Payer: [employer] [place] Medication & CPT/J Code(s) submitted: EVENITY [id number] Route of submission (phone, fax, portal):  Phone #[phone] Fax # Auth type: Buy/Bill PB Units/visits requested: 12 DOSES Reference number: ASO [id number] Approval from: 12/13/22 to 12/12/23   Co-pay card : Pending [employer] aware

## 2022-12-15 NOTE — Progress Notes (Signed)
Evenity scheduled for 01/09/23

## 2023-01-09 ENCOUNTER — Ambulatory Visit: Payer: BC Managed Care – PPO

## 2023-01-09 VITALS — BP 107/67 | HR 64 | Temp 97.4°F | Resp 16 | Ht 65.0 in | Wt 141.8 lb

## 2023-01-09 DIAGNOSIS — M810 Age-related osteoporosis without current pathological fracture: Secondary | ICD-10-CM

## 2023-01-09 MED ORDER — ROMOSOZUMAB-AQQG 105 MG/1.17ML ~~LOC~~ SOSY
210.0000 mg | PREFILLED_SYRINGE | Freq: Once | SUBCUTANEOUS | Status: AC
  Administered 2023-01-09: 210 mg via SUBCUTANEOUS
  Filled 2023-01-09: qty 2.34

## 2023-01-09 NOTE — Progress Notes (Signed)
 Diagnosis: Osteoporosis  Provider:  Mannam, Praveen MD  Procedure: Injection  Evenity  (Romosozumab -aqqg), Dose: 210 mg, Site: subcutaneous, Number of injections: 2  Injection Site(s): Left arm and Right arm  Post Care: Observation period completed  Discharge: Condition: Good, Destination: Home . AVS Provided  Performed by:  Rocky FORBES Sar, RN

## 2023-01-22 NOTE — Progress Notes (Unsigned)
Office Visit Note  Patient: Renee Wright             Date of Birth: [birth year]           MRN: [id number]             PCP: Marylen Ponto, MD Referring: Marylen Ponto, MD Visit Date: 02/05/2023 Occupation: @GUAROCC @  Subjective:  Medication monitoring  History of Present Illness: Renee Wright is a 53 y.o. female with history of osteoporosis.  Patient was started on Evenity on 01/09/2023.  She tolerated Evenity without any side effects or injection site reactions.  She is due for her next dose of Evenity tomorrow.  Patient denies any falls or fractures since her last office visit.  She remains active exercising on a regular basis. She denies any new medical conditions.     Activities of Daily Living:  Patient reports morning stiffness for 0  none .   Patient Denies nocturnal pain.  Difficulty dressing/grooming: Denies Difficulty climbing stairs: Denies Difficulty getting out of chair: Denies Difficulty using hands for taps, buttons, cutlery, and/or writing: Denies  Review of Systems  Constitutional:  Negative for fatigue.  HENT:  Negative for mouth sores and mouth dryness.   Eyes:  Negative for dryness.  Respiratory:  Negative for shortness of breath.   Cardiovascular:  Negative for chest pain and palpitations.  Gastrointestinal:  Negative for blood in stool, constipation and diarrhea.  Endocrine: Negative for increased urination.  Genitourinary:  Negative for involuntary urination.  Musculoskeletal:  Negative for joint pain, gait problem, joint pain, joint swelling, myalgias, muscle weakness, morning stiffness, muscle tenderness and myalgias.  Skin:  Negative for color change, rash, hair loss and sensitivity to sunlight.  Allergic/Immunologic: Negative for susceptible to infections.  Neurological:  Negative for dizziness and headaches.  Hematological:  Negative for swollen glands.  Psychiatric/Behavioral:  Negative for depressed mood and sleep disturbance. The patient  is not nervous/anxious.     PMFS History:  Patient Active Problem List   Diagnosis Date Noted   Osteoporosis, postmenopausal 12/13/2022   Genetic testing 04/14/2015   Family history of breast cancer    Family history of uterine cancer    Family history of melanoma    Family history of colon cancer    Malignant neoplasm of upper-inner quadrant of right breast in female, estrogen receptor positive (HCC) 03/26/2015   Parasomnia, organic 10/19/2014   Nightmares REM-sleep type 10/19/2014   Irritable bowel syndrome with both constipation and diarrhea 10/19/2014   Breast cyst 01/13/2011    Past Medical History:  Diagnosis Date   Breast calcification, right 01/2011   Breast cancer (HCC)    Breast cancer of upper-inner quadrant of right female breast (HCC) 03/26/2015   Breast cyst 01/13/2011   Right breast at 7:30 areolar edge    Family history of breast cancer    Family history of colon cancer    Family history of melanoma    Family history of uterine cancer    IBS (irritable bowel syndrome)    Personal history of radiation therapy 2017   PONV (postoperative nausea and vomiting)     Family History  Problem Relation Age of Onset   Uterine cancer Mother 79   Atrial fibrillation Mother    Kidney Stones Mother    Osteoporosis Mother    Hydrocephalus Brother    Breast cancer Maternal Aunt 65   Melanoma Maternal Grandmother 75       mets to bladder  Colon cancer Paternal Grandmother        dx over 39   Healthy Daughter    Crohn's disease Daughter    Healthy Daughter    Past Surgical History:  Procedure Laterality Date   BREAST BIOPSY Left 10/1991   BREAST BIOPSY  02/02/2011   BREAST BIOPSY Right 03/20/2015   BREAST EXCISIONAL BIOPSY Right 02/02/2011   BREAST LUMPECTOMY Right 05/27/2015   BREAST LUMPECTOMY WITH RADIOACTIVE SEED AND SENTINEL LYMPH NODE BIOPSY Right 05/27/2015   Procedure: BREAST LUMPECTOMY WITH RADIOACTIVE SEED AND SENTINEL LYMPH NODE BIOPSY;  Surgeon: Harriette Bouillon, MD;  Location: [facility];  Service: General;  Laterality: Right;   CESAREAN SECTION  05/29/96, 03/30/00   Social History   Social History Narrative   Married, 2 children.      Drinks one cup of caffeine daily.   Immunization History  Administered Date(s) Administered   Influenza-Unspecified 10/09/2017     Objective: Vital Signs: BP 92/62 (BP Location: Left Arm, Patient Position: Sitting, Cuff Size: Normal)   Pulse 60   Resp 14   Ht 5\' 5"  (1.651 m)   Wt 138 lb (62.6 kg)   BMI 22.96 kg/m    Physical Exam Vitals and nursing note reviewed.  Constitutional:      Appearance: She is well-developed.  HENT:     Head: Normocephalic and atraumatic.  Eyes:     Conjunctiva/sclera: Conjunctivae normal.  Cardiovascular:     Rate and Rhythm: Normal rate and regular rhythm.     Heart sounds: Normal heart sounds.  Pulmonary:     Effort: Pulmonary effort is normal.     Breath sounds: Normal breath sounds.  Abdominal:     General: Bowel sounds are normal.     Palpations: Abdomen is soft.  Musculoskeletal:     Cervical back: Normal range of motion.  Lymphadenopathy:     Cervical: No cervical adenopathy.  Skin:    General: Skin is warm and dry.     Capillary Refill: Capillary refill takes less than 2 seconds.  Neurological:     Mental Status: She is alert and oriented to person, place, and time.  Psychiatric:        Behavior: Behavior normal.      Musculoskeletal Exam: C-spine, thoracic spine, lumbar spine have good range of motion.  Shoulder joints, with joints, wrist joints, MCPs, PIPs, DIPs have good range of motion with no synovitis.  Complete fist formation bilaterally.  Hip joints have good range of motion with no groin pain.  Some crepitus in the right knee noted.  No warmth or effusion in the knee joints noted.  Ankle joints have good range of motion with no tenderness or joint swelling.  CDAI Exam: CDAI Score: -- Patient Global: --; Provider  Global: -- Swollen: --; Tender: -- Joint Exam 02/05/2023   No joint exam has been documented for this visit   There is currently no information documented on the homunculus. Go to the Rheumatology activity and complete the homunculus joint exam.  Investigation: No additional findings.  Imaging: No results found.  Recent Labs: Lab Results  Component Value Date   WBC 5.4 12/05/2022   HGB 13.9 12/05/2022   PLT 262 12/05/2022   NA 139 12/05/2022   K 4.6 12/05/2022   CL 104 12/05/2022   CO2 28 12/05/2022   GLUCOSE 94 12/05/2022   BUN 14 12/05/2022   CREATININE 0.90 12/05/2022   BILITOT 0.5 12/05/2022   ALKPHOS 58 09/28/2020  AST 37 (H) 12/05/2022   ALT 11 12/05/2022   PROT 6.8 12/05/2022   PROT 6.8 12/05/2022   ALBUMIN 3.8 09/28/2020   CALCIUM 9.6 12/05/2022   GFRAA >60 09/29/2019    Speciality Comments: No specialty comments available.  Procedures:  No procedures performed Allergies: Patient has no known allergies.   Assessment / Plan:     Visit Diagnoses: Age-related osteoporosis without current pathological fracture:  DEXA 03/06/22: LFN BMD 0.498 T-score -3.2, Lumbar spine T-score -3.0--first DEXA. FSH and estradiol level May 2019 consistent with menopause.  History of breast cancer-required lumpectomy and radiation therapy-completed in August 2017.  No history of long-term prednisone use.  No history of fractures. Patient has been strength training 3 days a week and taking a daily calcium and collagen supplement. Previous therapy: Fosamax started spring 2024-treated for 5 to 6 weeks-discontinued due to GI intolerance. Patient was initiated on Evenity on 01/09/2023 and is due for her next injection tomorrow on 02/06/2023.  She tolerated Evenity without any side effects or injection site reactions.  Patient was encouraged to start taking a maintenance dose of vitamin D 1000 units daily.  She has not had any recent falls or fractures.  She will follow-up in the office in  6 months or sooner if needed.  Vitamin D deficiency: Vitamin D was 34 on 12/05/2022.  Recommended vitamin D 1000 units daily as a maintenance dose.   Medication monitoring encounter -Evenity 210 mg monthly sq injections x12 months--initiated on 01/09/2023.  Lab work from 12/05/2022 was reviewed with the patient today in the office: PTH within normal limits, vitamin D 34, phosphorus within normal limits, TSH within normal limits, SPEP normal, CBC within normal limits, calcium 9.6, creatinine 0.90, GFR 76, ALT 11, AST 37.  Other medical conditions are listed as follows:   Malignant neoplasm of upper-inner quadrant of right breast in female, estrogen receptor positive (HCC)  Sarcoma of uterus (HCC)  Nightmares REM-sleep type  Irritable bowel syndrome with both constipation and diarrhea  Family history of breast cancer  Family history of melanoma  Family history of colon cancer  Family history of uterine cancer  Parasomnia, organic  History of herpes zoster  Orders: No orders of the defined types were placed in this encounter.  No orders of the defined types were placed in this encounter.   Follow-Up Instructions: Return in about 6 months (around 08/05/2023) for Osteoporosis.   Gearldine Bienenstock, PA-C  Note - This record has been created using Dragon software.  Chart creation errors have been sought, but may not always  have been located. Such creation errors do not reflect on  the standard of medical care.

## 2023-02-05 ENCOUNTER — Encounter: Payer: Self-pay | Admitting: Physician Assistant

## 2023-02-05 ENCOUNTER — Ambulatory Visit: Payer: BC Managed Care – PPO | Attending: Physician Assistant | Admitting: Physician Assistant

## 2023-02-05 VITALS — BP 92/62 | HR 60 | Resp 14 | Ht 65.0 in | Wt 138.0 lb

## 2023-02-05 DIAGNOSIS — C50211 Malignant neoplasm of upper-inner quadrant of right female breast: Secondary | ICD-10-CM | POA: Diagnosis not present

## 2023-02-05 DIAGNOSIS — K582 Mixed irritable bowel syndrome: Secondary | ICD-10-CM

## 2023-02-05 DIAGNOSIS — Z800 Family history of malignant neoplasm of digestive organs: Secondary | ICD-10-CM

## 2023-02-05 DIAGNOSIS — M810 Age-related osteoporosis without current pathological fracture: Secondary | ICD-10-CM | POA: Diagnosis not present

## 2023-02-05 DIAGNOSIS — Z5181 Encounter for therapeutic drug level monitoring: Secondary | ICD-10-CM

## 2023-02-05 DIAGNOSIS — E559 Vitamin D deficiency, unspecified: Secondary | ICD-10-CM

## 2023-02-05 DIAGNOSIS — Z803 Family history of malignant neoplasm of breast: Secondary | ICD-10-CM

## 2023-02-05 DIAGNOSIS — Z8619 Personal history of other infectious and parasitic diseases: Secondary | ICD-10-CM

## 2023-02-05 DIAGNOSIS — F515 Nightmare disorder: Secondary | ICD-10-CM

## 2023-02-05 DIAGNOSIS — Z808 Family history of malignant neoplasm of other organs or systems: Secondary | ICD-10-CM

## 2023-02-05 DIAGNOSIS — Z8049 Family history of malignant neoplasm of other genital organs: Secondary | ICD-10-CM

## 2023-02-05 DIAGNOSIS — Z170 Estrogen receptor positive status [ER+]: Secondary | ICD-10-CM

## 2023-02-05 DIAGNOSIS — G4750 Parasomnia, unspecified: Secondary | ICD-10-CM

## 2023-02-05 DIAGNOSIS — C549 Malignant neoplasm of corpus uteri, unspecified: Secondary | ICD-10-CM

## 2023-02-06 ENCOUNTER — Ambulatory Visit: Payer: BC Managed Care – PPO | Admitting: *Deleted

## 2023-02-06 VITALS — BP 106/65 | HR 60 | Temp 98.3°F | Resp 16 | Ht 65.0 in | Wt 139.6 lb

## 2023-02-06 DIAGNOSIS — M810 Age-related osteoporosis without current pathological fracture: Secondary | ICD-10-CM | POA: Diagnosis not present

## 2023-02-06 MED ORDER — ROMOSOZUMAB-AQQG 105 MG/1.17ML ~~LOC~~ SOSY
210.0000 mg | PREFILLED_SYRINGE | Freq: Once | SUBCUTANEOUS | Status: AC
  Administered 2023-02-06: 210 mg via SUBCUTANEOUS
  Filled 2023-02-06: qty 2.34

## 2023-02-06 NOTE — Progress Notes (Signed)
Diagnosis: Osteoporosis  Provider:  Chilton Greathouse MD  Procedure: Injection  Evenity (Romosozumab-aqqg), Dose: 210 mg, Site: subcutaneous, Number of injections: 2  Injection Site(s): Left arm and Right arm  Post Care: Observation period completed  Discharge: Condition: Good, Destination: Home . AVS Declined  Performed by:  Forrest Moron, RN

## 2023-03-06 ENCOUNTER — Ambulatory Visit (INDEPENDENT_AMBULATORY_CARE_PROVIDER_SITE_OTHER): Payer: BC Managed Care – PPO

## 2023-03-06 VITALS — BP 108/61 | HR 65 | Temp 98.0°F | Resp 16 | Ht 65.0 in | Wt 139.6 lb

## 2023-03-06 DIAGNOSIS — M810 Age-related osteoporosis without current pathological fracture: Secondary | ICD-10-CM | POA: Diagnosis not present

## 2023-03-06 MED ORDER — ROMOSOZUMAB-AQQG 105 MG/1.17ML ~~LOC~~ SOSY
210.0000 mg | PREFILLED_SYRINGE | Freq: Once | SUBCUTANEOUS | Status: AC
  Administered 2023-03-06: 210 mg via SUBCUTANEOUS
  Filled 2023-03-06: qty 2.34

## 2023-03-06 NOTE — Progress Notes (Signed)
 Diagnosis: Osteoporosis  Provider:  Chilton Greathouse MD  Procedure: Injection  Evenity (Romosozumab-aqqg), Dose: 210 mg, Site: subcutaneous, Number of injections: 2  Injection Site(s): Left arm and Right arm  Post Care: Patient declined observation  Discharge: Condition: Good, Destination: Home . AVS Declined  Performed by:  Wyvonne Lenz, RN

## 2023-04-03 ENCOUNTER — Ambulatory Visit (INDEPENDENT_AMBULATORY_CARE_PROVIDER_SITE_OTHER): Payer: BC Managed Care – PPO

## 2023-04-03 VITALS — BP 100/67 | HR 59 | Temp 98.1°F | Resp 16 | Ht 65.0 in | Wt 141.6 lb

## 2023-04-03 DIAGNOSIS — M810 Age-related osteoporosis without current pathological fracture: Secondary | ICD-10-CM

## 2023-04-03 MED ORDER — ROMOSOZUMAB-AQQG 105 MG/1.17ML ~~LOC~~ SOSY
210.0000 mg | PREFILLED_SYRINGE | Freq: Once | SUBCUTANEOUS | Status: AC
  Administered 2023-04-03: 210 mg via SUBCUTANEOUS

## 2023-04-03 NOTE — Progress Notes (Signed)
 Diagnosis: Osteoporosis  Provider:  Chilton Greathouse MD  Procedure: Injection  Evenity (Romosozumab-aqqg), Dose: 210 mg, Site: subcutaneous, Number of injections: 2  Injection Site(s): Left arm and Right arm  Post Care:  left and right arm injection  Discharge: Condition: Good, Destination: Home . AVS Declined  Performed by:  Rico Ala, LPN

## 2023-04-10 ENCOUNTER — Telehealth: Payer: Self-pay | Admitting: *Deleted

## 2023-04-10 DIAGNOSIS — M810 Age-related osteoporosis without current pathological fracture: Secondary | ICD-10-CM

## 2023-04-10 NOTE — Telephone Encounter (Signed)
 Received a call from Alexa with [employer]. Call back number [phone]  Patient has been getting an infusion at [facility] with procedure code [id number]. Patient has been getting denied for this because the medication must be purchased though their speciality pharmacy to apply benefits.

## 2023-04-17 MED ORDER — EVENITY 105 MG/1.17ML ~~LOC~~ SOSY
210.0000 mg | PREFILLED_SYRINGE | SUBCUTANEOUS | 7 refills | Status: AC
Start: 2023-04-17 — End: ?

## 2023-04-17 NOTE — Telephone Encounter (Signed)
 I spoke to [facility]. [facility] to [facility] for [facility].  [facility]: Ph # [phone] Fax # [phone]  Reatha Armour, RN with the [facility] ASBM benefit team called and informed me that they are requesting further evaluation to see if they can cover the 4 injections that were bought and billed here. This process will take 7-14 business days.  Sue Lush gave me the patients pharmacy benefit billing information in case [facility] calls [place] and asks for it. That information is as follows: BIN: [id number] PCN: [id number] Rxgroup: [id number] ID: [id number]  Andreas direct line is [phone] should we need to contact her for assistance with this.   I called the patient and gave her all of the above information, she verbalized understanding and appreciation.

## 2023-04-17 NOTE — Telephone Encounter (Signed)
 Rx for Evenity sent to [facility] today  Chesley Mires, PharmD, MPH, BCPS, CPP Clinical Pharmacist (Rheumatology and Pulmonology)

## 2023-04-17 NOTE — Addendum Note (Signed)
 Addended by: Murrell Redden on: 04/17/2023 10:36 AM   Modules accepted: Orders

## 2023-04-18 NOTE — Telephone Encounter (Signed)
 I overlooked this.  Placing it at the bottom is not good.  Learning lesson for sure.  I will update the excell sheet and add fyi flag.  Selena Batten

## 2023-04-18 NOTE — Telephone Encounter (Signed)
 The authorization letter in December states that the Evenity must come from a specialty pharmacy (it's sadly the last bullet on the letter) - rep also called me at clinic and pointed me to this because I thought the same thing  Chesley Mires, PharmD, MPH, BCPS, CPP Clinical Pharmacist (Rheumatology and Pulmonology)

## 2023-04-19 NOTE — Telephone Encounter (Signed)
 Received notice that [employer] will not approve the exception for the Evenity.  It has been denied.  Letter has been scanned to media tab for your review.  Selena Batten

## 2023-04-26 ENCOUNTER — Telehealth: Payer: Self-pay | Admitting: Pharmacy Technician

## 2023-04-26 NOTE — Telephone Encounter (Addendum)
 [facility] (Rep: Arvel Lather @9 :22a) and script is still pending. Patient has an upcoming appt 4/22.   Explained to patient we may have to cancel appt on 4/22 if med has not arrived by then.   Will reach out to patient Monday 4/21 and let her know if the medication has arrived. [facility] so she can call and give verbal consent for med to be shipped to our site. Patient understood and was extremely nice.

## 2023-05-01 ENCOUNTER — Ambulatory Visit

## 2023-05-01 VITALS — BP 104/69 | HR 67 | Temp 97.9°F | Resp 16 | Ht 65.0 in | Wt 138.0 lb

## 2023-05-01 DIAGNOSIS — M810 Age-related osteoporosis without current pathological fracture: Secondary | ICD-10-CM

## 2023-05-01 MED ORDER — ROMOSOZUMAB-AQQG 105 MG/1.17ML ~~LOC~~ SOSY
210.0000 mg | PREFILLED_SYRINGE | Freq: Once | SUBCUTANEOUS | Status: AC
  Administered 2023-05-01: 210 mg via SUBCUTANEOUS
  Filled 2023-05-01: qty 2.4

## 2023-05-01 NOTE — Progress Notes (Signed)
 Diagnosis: Osteoporosis  Provider:  Chilton Greathouse MD  Procedure: Injection  Evenity (Romosozumab-aqqg), Dose: 210 mg, Site: subcutaneous, Number of injections: 2  Injection Site(s): Left arm and Right arm  Post Care: Patient declined observation  Discharge: Condition: Good, Destination: Home . AVS Declined  Performed by:  Loney Hering, LPN

## 2023-05-30 ENCOUNTER — Ambulatory Visit (INDEPENDENT_AMBULATORY_CARE_PROVIDER_SITE_OTHER)

## 2023-05-30 VITALS — BP 108/70 | HR 68 | Temp 97.7°F | Resp 14 | Ht 65.0 in | Wt 138.0 lb

## 2023-05-30 DIAGNOSIS — M810 Age-related osteoporosis without current pathological fracture: Secondary | ICD-10-CM

## 2023-05-30 MED ORDER — ROMOSOZUMAB-AQQG 105 MG/1.17ML ~~LOC~~ SOSY
210.0000 mg | PREFILLED_SYRINGE | Freq: Once | SUBCUTANEOUS | Status: AC
  Administered 2023-05-30: 210 mg via SUBCUTANEOUS
  Filled 2023-05-30: qty 2.4

## 2023-05-30 NOTE — Progress Notes (Signed)
 Diagnosis: Osteoporosis  Provider:  Chilton Greathouse MD  Procedure: Injection  Evenity (Romosozumab-aqqg), Dose: 210 mg, Site: subcutaneous, Number of injections: 2  Injection Site(s): Left arm and Right arm  Post Care: Patient declined observation  Discharge: Condition: Good, Destination: Home . AVS Declined  Performed by:  Loney Hering, LPN

## 2023-06-27 ENCOUNTER — Ambulatory Visit: Admitting: *Deleted

## 2023-06-27 VITALS — BP 120/69 | HR 61 | Temp 98.1°F | Resp 18 | Ht 65.0 in | Wt 136.6 lb

## 2023-06-27 DIAGNOSIS — M810 Age-related osteoporosis without current pathological fracture: Secondary | ICD-10-CM | POA: Diagnosis not present

## 2023-06-27 MED ORDER — ROMOSOZUMAB-AQQG 105 MG/1.17ML ~~LOC~~ SOSY
210.0000 mg | PREFILLED_SYRINGE | Freq: Once | SUBCUTANEOUS | Status: AC
  Administered 2023-06-27: 210 mg via SUBCUTANEOUS
  Filled 2023-06-27: qty 2.4

## 2023-06-27 NOTE — Progress Notes (Signed)
 Diagnosis: Osteoporosis  Provider:  Mannam, Praveen MD  Procedure: Injection  Evenity  (Romosozumab -aqqg), Dose: 210 mg, Site: subcutaneous, Number of injections: 7  Injection Site(s): Left arm right arm   Post Care: Patient declined observation  Discharge: Condition: Good, Destination: Home . AVS Declined  Performed by:  Devonne Folk, RN

## 2023-06-29 ENCOUNTER — Other Ambulatory Visit: Payer: Self-pay | Admitting: Obstetrics and Gynecology

## 2023-06-29 DIAGNOSIS — Z1231 Encounter for screening mammogram for malignant neoplasm of breast: Secondary | ICD-10-CM

## 2023-07-17 NOTE — Progress Notes (Signed)
 Office Visit Note  Patient: Renee Wright             Date of Birth: [birth year]           MRN: [id number]             PCP: Ina Marcellus RAMAN, MD Referring: Ina Marcellus RAMAN, MD Visit Date: 07/31/2023 Occupation: @GUAROCC @  Subjective:  Medication monitoring   History of Present Illness: Renee Wright is a 53 y.o. female with history of osteoporosis. Patient remains on Evenity  210 mg monthly sq injections x12 months--initiated on 01/09/2023.  Overall she has been tolerating avidity.  She has occasional local injection site reactions which have been resolving within 24 to 36 hours.  She has been taking an AlgaeCal supplements daily.  She denies any falls or fractures.  Patient remains active exercising on a regular basis.  She has been going to exercise classes at the gym. Patient plans on scheduling an updated yearly physical with her PCP.   Activities of Daily Living:  Patient denies any morning stiffness    Patient Denies nocturnal pain.  Difficulty dressing/grooming: Denies Difficulty climbing stairs: Denies Difficulty getting out of chair: Denies Difficulty using hands for taps, buttons, cutlery, and/or writing: Denies  Review of Systems  Constitutional:  Negative for fatigue.  HENT:  Negative for mouth sores and mouth dryness.   Eyes:  Negative for dryness.  Respiratory:  Negative for shortness of breath.   Cardiovascular:  Negative for chest pain and palpitations.  Gastrointestinal:  Negative for blood in stool, constipation and diarrhea.  Endocrine: Negative for increased urination.  Genitourinary:  Negative for involuntary urination.  Musculoskeletal:  Negative for joint pain, gait problem, joint pain, joint swelling, myalgias, muscle weakness, morning stiffness, muscle tenderness and myalgias.  Skin:  Negative for color change and hair loss.  Allergic/Immunologic: Negative for susceptible to infections.  Neurological:  Negative for dizziness and headaches.   Psychiatric/Behavioral:  Negative for depressed mood and sleep disturbance. The patient is not nervous/anxious.     PMFS History:  Patient Active Problem List   Diagnosis Date Noted   Osteoporosis, postmenopausal 12/13/2022   Genetic testing 04/14/2015   Family history of breast cancer    Family history of uterine cancer    Family history of melanoma    Family history of colon cancer    Malignant neoplasm of upper-inner quadrant of right breast in female, estrogen receptor positive (HCC) 03/26/2015   Parasomnia, organic 10/19/2014   Nightmares REM-sleep type 10/19/2014   Irritable bowel syndrome with both constipation and diarrhea 10/19/2014   Breast cyst 01/13/2011    Past Medical History:  Diagnosis Date   Breast calcification, right 01/2011   Breast cancer (HCC)    Breast cancer of upper-inner quadrant of right female breast (HCC) 03/26/2015   Breast cyst 01/13/2011   Right breast at 7:30 areolar edge    Family history of breast cancer    Family history of colon cancer    Family history of melanoma    Family history of uterine cancer    IBS (irritable bowel syndrome)    Personal history of radiation therapy 2017   PONV (postoperative nausea and vomiting)     Family History  Problem Relation Age of Onset   Uterine cancer Mother 8   Atrial fibrillation Mother    Kidney Stones Mother    Osteoporosis Mother    Hydrocephalus Brother    Breast cancer Maternal Aunt 42   Melanoma Maternal Grandmother  75       mets to bladder   Colon cancer Paternal Grandmother        dx over 44   Healthy Daughter    Crohn's disease Daughter    Healthy Daughter    Past Surgical History:  Procedure Laterality Date   BREAST BIOPSY Left 10/1991   BREAST BIOPSY  02/02/2011   BREAST BIOPSY Right 03/20/2015   BREAST EXCISIONAL BIOPSY Right 02/02/2011   BREAST LUMPECTOMY Right 05/27/2015   BREAST LUMPECTOMY WITH RADIOACTIVE SEED AND SENTINEL LYMPH NODE BIOPSY Right 05/27/2015   Procedure:  BREAST LUMPECTOMY WITH RADIOACTIVE SEED AND SENTINEL LYMPH NODE BIOPSY;  Surgeon: Debby Shipper, MD;  Location: [facility];  Service: General;  Laterality: Right;   CESAREAN SECTION  05/29/96, 03/30/00   Social History   Social History Narrative   Married, 2 children.      Drinks one cup of caffeine daily.   Immunization History  Administered Date(s) Administered   Influenza-Unspecified 10/09/2017     Objective: Vital Signs: BP 91/61 (BP Location: Left Arm, Patient Position: Sitting)   Pulse 67   Resp 14   Ht 5' 5 (1.651 m)   Wt 138 lb 9.6 oz (62.9 kg)   LMP 04/10/2015 (LMP Unknown)   BMI 23.06 kg/m    Physical Exam Vitals and nursing note reviewed.  Constitutional:      Appearance: She is well-developed.  HENT:     Head: Normocephalic and atraumatic.  Eyes:     Conjunctiva/sclera: Conjunctivae normal.  Cardiovascular:     Rate and Rhythm: Normal rate and regular rhythm.     Heart sounds: Normal heart sounds.  Pulmonary:     Effort: Pulmonary effort is normal.     Breath sounds: Normal breath sounds.  Abdominal:     General: Bowel sounds are normal.     Palpations: Abdomen is soft.  Musculoskeletal:     Cervical back: Normal range of motion.  Lymphadenopathy:     Cervical: No cervical adenopathy.  Skin:    General: Skin is warm and dry.     Capillary Refill: Capillary refill takes less than 2 seconds.  Neurological:     Mental Status: She is alert and oriented to person, place, and time.  Psychiatric:        Behavior: Behavior normal.      Musculoskeletal Exam: C-spine, thoracic spine, lumbar spine good range of motion.  Shoulder joints, joints, wrist joints, MCPs, PIPs, DIPs a good range of motion with no synovitis.  Complete fist formation bilaterally.  Hip joints have good range of motion with no groin pain.  Knee joints have good range of motion with no warmth or effusion.  Ankle joints have good range of motion with no tenderness or joint  swelling.    CDAI Exam: CDAI Score: -- Patient Global: --; Provider Global: -- Swollen: --; Tender: -- Joint Exam 07/31/2023   No joint exam has been documented for this visit   There is currently no information documented on the homunculus. Go to the Rheumatology activity and complete the homunculus joint exam.  Investigation: No additional findings.  Imaging: No results found.  Recent Labs: Lab Results  Component Value Date   WBC 5.4 12/05/2022   HGB 13.9 12/05/2022   PLT 262 12/05/2022   NA 139 12/05/2022   K 4.6 12/05/2022   CL 104 12/05/2022   CO2 28 12/05/2022   GLUCOSE 94 12/05/2022   BUN 14 12/05/2022   CREATININE 0.90  12/05/2022   BILITOT 0.5 12/05/2022   ALKPHOS 58 09/28/2020   AST 37 (H) 12/05/2022   ALT 11 12/05/2022   PROT 6.8 12/05/2022   PROT 6.8 12/05/2022   ALBUMIN 3.8 09/28/2020   CALCIUM 9.6 12/05/2022   GFRAA >60 09/29/2019    Speciality Comments: No specialty comments available.  Procedures:  No procedures performed Allergies: Patient has no known allergies.     Assessment / Plan:     Visit Diagnoses: Age-related osteoporosis without current pathological fracture: DEXA 03/06/22: LFN BMD 0.498 T-score -3.2, Lumbar spine T-score -3.0--first DEXA. FSH and estradiol  level May 2019 consistent with menopause.  History of breast cancer-required lumpectomy and radiation therapy-completed in August 2017.  No history of long-term prednisone use.  No history of fractures. Patient has been strength training 3 days a week and taking a daily algaecal and collagen supplement. Previous therapy: Fosamax started spring 2024-treated for 5 to 6 weeks-discontinued due to GI intolerance. Patient was initiated on Evenity  monthly injections on 01/09/2023. Overall tolerating evenity  without any major side effects. No recent falls or fractures.  Discussed that she will likely transition to IV reclast once she has completed 45-months of evenity . Plan to further  discuss reclast in detail at scheduled follow up visit.  Plan to repeat DEXA in February 2026. She will follow up in 6 months or sooner if needed.   Vitamin D  deficiency -Plan to check vitamin D  today.  Plan: VITAMIN D  25 Hydroxy (Vit-D Deficiency, Fractures)  Medication monitoring encounter - Evenity  210 mg monthly sq injections x12 months--initiated on 01/09/2023. Plan to check CBC, CMP, and vitamin D  today--results will be forwarded to PCP.  - Plan: CBC with Differential/Platelet, VITAMIN D  25 Hydroxy (Vit-D Deficiency, Fractures), Comprehensive metabolic panel with GFR  Other medical conditions are listed as follows:   Malignant neoplasm of upper-inner quadrant of right breast in female, estrogen receptor positive (HCC)  Sarcoma of uterus (HCC)  Nightmares REM-sleep type  Irritable bowel syndrome with both constipation and diarrhea  Family history of breast cancer  Family history of melanoma  Family history of colon cancer  Family history of uterine cancer  Parasomnia, organic  History of herpes zoster  Orders: Orders Placed This Encounter  Procedures   CBC with Differential/Platelet   VITAMIN D  25 Hydroxy (Vit-D Deficiency, Fractures)   Comprehensive metabolic panel with GFR   No orders of the defined types were placed in this encounter.    Follow-Up Instructions: Return in about 6 months (around 01/31/2024).   Waddell CHRISTELLA Craze, PA-C  Note - This record has been created using Dragon software.  Chart creation errors have been sought, but may not always  have been located. Such creation errors do not reflect on  the standard of medical care.

## 2023-07-25 ENCOUNTER — Ambulatory Visit

## 2023-07-25 VITALS — BP 104/70 | HR 59 | Temp 98.1°F | Resp 16 | Ht 65.0 in | Wt 139.0 lb

## 2023-07-25 DIAGNOSIS — M810 Age-related osteoporosis without current pathological fracture: Secondary | ICD-10-CM

## 2023-07-25 MED ORDER — ROMOSOZUMAB-AQQG 105 MG/1.17ML ~~LOC~~ SOSY
210.0000 mg | PREFILLED_SYRINGE | Freq: Once | SUBCUTANEOUS | Status: AC
  Administered 2023-07-25: 210 mg via SUBCUTANEOUS
  Filled 2023-07-25: qty 2.4

## 2023-07-25 NOTE — Progress Notes (Signed)
 Diagnosis: Osteoporosis  Provider:  Mannam, Praveen MD  Procedure: Injection  Evenity  (Romosozumab -aqqg), Dose: 210 mg, Site: subcutaneous, Number of injections: 2  Injection Site(s): Left arm and Right arm  Post Care:  left and right arm injections  Discharge: Condition: Good, Destination: Home . AVS Declined  Performed by:  Lauran Pollard, LPN

## 2023-07-31 ENCOUNTER — Encounter: Payer: Self-pay | Admitting: Physician Assistant

## 2023-07-31 ENCOUNTER — Ambulatory Visit: Attending: Physician Assistant | Admitting: Physician Assistant

## 2023-07-31 VITALS — BP 91/61 | HR 67 | Resp 14 | Ht 65.0 in | Wt 138.6 lb

## 2023-07-31 DIAGNOSIS — C50211 Malignant neoplasm of upper-inner quadrant of right female breast: Secondary | ICD-10-CM | POA: Diagnosis not present

## 2023-07-31 DIAGNOSIS — G4750 Parasomnia, unspecified: Secondary | ICD-10-CM

## 2023-07-31 DIAGNOSIS — Z170 Estrogen receptor positive status [ER+]: Secondary | ICD-10-CM

## 2023-07-31 DIAGNOSIS — C549 Malignant neoplasm of corpus uteri, unspecified: Secondary | ICD-10-CM

## 2023-07-31 DIAGNOSIS — Z5181 Encounter for therapeutic drug level monitoring: Secondary | ICD-10-CM

## 2023-07-31 DIAGNOSIS — Z8619 Personal history of other infectious and parasitic diseases: Secondary | ICD-10-CM

## 2023-07-31 DIAGNOSIS — M810 Age-related osteoporosis without current pathological fracture: Secondary | ICD-10-CM | POA: Diagnosis not present

## 2023-07-31 DIAGNOSIS — E559 Vitamin D deficiency, unspecified: Secondary | ICD-10-CM | POA: Diagnosis not present

## 2023-07-31 DIAGNOSIS — Z800 Family history of malignant neoplasm of digestive organs: Secondary | ICD-10-CM

## 2023-07-31 DIAGNOSIS — K582 Mixed irritable bowel syndrome: Secondary | ICD-10-CM

## 2023-07-31 DIAGNOSIS — F515 Nightmare disorder: Secondary | ICD-10-CM

## 2023-07-31 DIAGNOSIS — Z803 Family history of malignant neoplasm of breast: Secondary | ICD-10-CM

## 2023-07-31 DIAGNOSIS — Z8049 Family history of malignant neoplasm of other genital organs: Secondary | ICD-10-CM

## 2023-07-31 DIAGNOSIS — Z808 Family history of malignant neoplasm of other organs or systems: Secondary | ICD-10-CM

## 2023-07-31 LAB — COMPREHENSIVE METABOLIC PANEL WITH GFR
AG Ratio: 1.8 (calc) (ref 1.0–2.5)
ALT: 7 U/L (ref 6–29)
AST: 31 U/L (ref 10–35)
Albumin: 4.2 g/dL (ref 3.6–5.1)
Alkaline phosphatase (APISO): 72 U/L (ref 37–153)
BUN: 16 mg/dL (ref 7–25)
CO2: 28 mmol/L (ref 20–32)
Calcium: 9.4 mg/dL (ref 8.6–10.4)
Chloride: 105 mmol/L (ref 98–110)
Creat: 0.9 mg/dL (ref 0.50–1.03)
Globulin: 2.3 g/dL (ref 1.9–3.7)
Glucose, Bld: 89 mg/dL (ref 65–99)
Potassium: 4.6 mmol/L (ref 3.5–5.3)
Sodium: 140 mmol/L (ref 135–146)
Total Bilirubin: 0.8 mg/dL (ref 0.2–1.2)
Total Protein: 6.5 g/dL (ref 6.1–8.1)
eGFR: 76 mL/min/1.73m2 (ref >=60)

## 2023-07-31 LAB — CBC WITH DIFFERENTIAL/PLATELET
Absolute Lymphocytes: 1371 {cells}/uL (ref 850–3900)
Absolute Monocytes: 524 {cells}/uL (ref 200–950)
Basophils Absolute: 41 {cells}/uL (ref 0–200)
Basophils Relative: 0.9 %
Eosinophils Absolute: 110 {cells}/uL (ref 15–500)
Eosinophils Relative: 2.4 %
HCT: 44 % (ref 35.0–45.0)
Hemoglobin: 14 g/dL (ref 11.7–15.5)
MCH: 28.3 pg (ref 27.0–33.0)
MCHC: 31.8 g/dL — ABNORMAL LOW (ref 32.0–36.0)
MCV: 88.9 fL (ref 80.0–100.0)
MPV: 10.8 fL (ref 7.5–12.5)
Monocytes Relative: 11.4 %
Neutro Abs: 2553 {cells}/uL (ref 1500–7800)
Neutrophils Relative %: 55.5 %
Platelets: 246 Thousand/uL (ref 140–400)
RBC: 4.95 Million/uL (ref 3.80–5.10)
RDW: 13.2 % (ref 11.0–15.0)
Total Lymphocyte: 29.8 %
WBC: 4.6 Thousand/uL (ref 3.8–10.8)

## 2023-07-31 LAB — VITAMIN D 25 HYDROXY (VIT D DEFICIENCY, FRACTURES): Vit D, 25-Hydroxy: 48 ng/mL (ref 30–100)

## 2023-08-01 ENCOUNTER — Ambulatory Visit: Payer: Self-pay | Admitting: Physician Assistant

## 2023-08-01 NOTE — Progress Notes (Signed)
 CMP WNL Vitamin D  WNL CBC WNL Please forward results to PCP as requested.

## 2023-08-02 ENCOUNTER — Ambulatory Visit

## 2023-08-07 ENCOUNTER — Ambulatory Visit: Payer: BC Managed Care – PPO | Admitting: Physician Assistant

## 2023-08-09 ENCOUNTER — Ambulatory Visit
Admission: RE | Admit: 2023-08-09 | Discharge: 2023-08-09 | Disposition: A | Discharge status: Home / Self Care | Source: Ambulatory Visit | Attending: Obstetrics and Gynecology

## 2023-08-09 DIAGNOSIS — Z1231 Encounter for screening mammogram for malignant neoplasm of breast: Secondary | ICD-10-CM

## 2023-08-22 ENCOUNTER — Ambulatory Visit

## 2023-08-23 ENCOUNTER — Encounter: Payer: Self-pay | Admitting: Physician Assistant

## 2023-08-23 ENCOUNTER — Ambulatory Visit (INDEPENDENT_AMBULATORY_CARE_PROVIDER_SITE_OTHER)

## 2023-08-23 VITALS — BP 105/71 | HR 65 | Temp 98.6°F | Resp 14 | Ht 65.0 in | Wt 140.0 lb

## 2023-08-23 DIAGNOSIS — M810 Age-related osteoporosis without current pathological fracture: Secondary | ICD-10-CM | POA: Diagnosis not present

## 2023-08-23 MED ORDER — ROMOSOZUMAB-AQQG 105 MG/1.17ML ~~LOC~~ SOSY
210.0000 mg | PREFILLED_SYRINGE | Freq: Once | SUBCUTANEOUS | Status: AC
Start: 2023-08-23 — End: 2023-08-23
  Administered 2023-08-23: 210 mg via SUBCUTANEOUS
  Filled 2023-08-23: qty 2.4

## 2023-08-23 NOTE — Progress Notes (Signed)
 Diagnosis: Osteoporosis  Provider:  Mannam, Praveen MD  Procedure: Injection  Evenity (Romosozumab-aqqg), Dose: 210 mg, Site: subcutaneous, Number of injections: 2  Injection Site(s): Left arm and Right arm  Post Care: left arm and right arm injections  Discharge: Condition: Good, Destination: Home . AVS Declined  Performed by:  Maximiano JONELLE Pouch, LPN

## 2023-09-04 ENCOUNTER — Encounter: Payer: Self-pay | Admitting: Physician Assistant

## 2023-09-19 ENCOUNTER — Ambulatory Visit

## 2023-10-04 ENCOUNTER — Ambulatory Visit

## 2023-10-04 VITALS — BP 105/64 | HR 67 | Temp 97.7°F | Resp 16 | Ht 65.0 in | Wt 138.0 lb

## 2023-10-04 DIAGNOSIS — M810 Age-related osteoporosis without current pathological fracture: Secondary | ICD-10-CM | POA: Diagnosis not present

## 2023-10-04 MED ORDER — ROMOSOZUMAB-AQQG 105 MG/1.17ML ~~LOC~~ SOSY
210.0000 mg | PREFILLED_SYRINGE | Freq: Once | SUBCUTANEOUS | Status: AC
  Administered 2023-10-04: 210 mg via SUBCUTANEOUS
  Filled 2023-10-04: qty 2.4

## 2023-10-04 NOTE — Progress Notes (Signed)
 Diagnosis: Osteoporosis  Provider:  Mannam, Praveen MD  Procedure: Injection  Evenity  (Romosozumab -aqqg), Dose: 210 mg, Site: subcutaneous, Number of injections: 2  Injection Site(s): Left lower quad. abdomen and Right lower quad. abdomne  Post Care: Patient declined observation  Discharge: Condition: Good, Destination: Home . AVS Declined  Performed by:  Star [place], LPN

## 2023-10-26 ENCOUNTER — Encounter: Payer: Self-pay | Admitting: Physician Assistant

## 2023-11-02 ENCOUNTER — Ambulatory Visit (INDEPENDENT_AMBULATORY_CARE_PROVIDER_SITE_OTHER)

## 2023-11-02 VITALS — BP 109/71 | HR 61 | Temp 97.8°F | Resp 14 | Ht 65.0 in | Wt 140.0 lb

## 2023-11-02 DIAGNOSIS — M810 Age-related osteoporosis without current pathological fracture: Secondary | ICD-10-CM

## 2023-11-02 MED ORDER — ROMOSOZUMAB-AQQG 105 MG/1.17ML ~~LOC~~ SOSY
210.0000 mg | PREFILLED_SYRINGE | Freq: Once | SUBCUTANEOUS | Status: AC
  Administered 2023-11-02: 210 mg via SUBCUTANEOUS
  Filled 2023-11-02: qty 2.4

## 2023-11-05 NOTE — Progress Notes (Addendum)
 Diagnosis: Osteoporosis  Provider:  Mannam, Praveen MD  Procedure: Injection  Evenity  (Romosozumab -aqqg), Dose: 210 mg, Site: subcutaneous, Number of injections: 2  Injection Site(s): Left lower quad. abdomen and Right lower quad. abdomne  Post Care:  left and right lower quad abdominal injections  Discharge: Condition: Good, Destination: Home . AVS Declined  Performed by:  Lauran Pollard, LPN

## 2023-11-28 ENCOUNTER — Telehealth: Payer: Self-pay | Admitting: Pharmacy Technician

## 2023-11-28 NOTE — Telephone Encounter (Signed)
 Patient will need x1 dose approved prior to auth expiring. Shara will expire on 12/12/23 and patient is scheduled on 12/30/23.  I have submitted an extension x1 dose.  Auth Submission: PENDING Site of care: Site of care: [facility] Payer: [employer] [place]  Medication & CPT/J Code(s) submitted: Evenity  (Romosozumab ) [id number] Diagnosis Code:  Route of submission (phone, fax, portal): FAXED Phone #[phone] Fax # Auth type: Buy/Bill PB Units/visits requested: X1 DOSE Reference number: [id number] Approval from:  to

## 2023-11-30 ENCOUNTER — Ambulatory Visit (INDEPENDENT_AMBULATORY_CARE_PROVIDER_SITE_OTHER)

## 2023-11-30 VITALS — BP 102/67 | HR 61 | Temp 98.0°F | Resp 14 | Ht 65.0 in | Wt 142.2 lb

## 2023-11-30 DIAGNOSIS — M810 Age-related osteoporosis without current pathological fracture: Secondary | ICD-10-CM | POA: Diagnosis not present

## 2023-11-30 MED ORDER — ROMOSOZUMAB-AQQG 105 MG/1.17ML ~~LOC~~ SOSY
210.0000 mg | PREFILLED_SYRINGE | Freq: Once | SUBCUTANEOUS | Status: AC
  Administered 2023-11-30: 210 mg via SUBCUTANEOUS
  Filled 2023-11-30: qty 2.4

## 2023-11-30 NOTE — Telephone Encounter (Signed)
 Auth Submission: APPROVED Site of care: Site of care: [facility] Payer: [employer] [place]  Medication & CPT/J Code(s) submitted: Evenity  (Romosozumab ) [id number] Diagnosis Code: M81.0 Route of submission (phone, fax, portal): fax Phone #[phone] Fax # Auth type: Buy/Bill PB Units/visits requested: 210mg  x 1 dose Reference number: ASO ASBM [id number] Approval from: 12/13/23 to 01/24/24

## 2023-11-30 NOTE — Progress Notes (Signed)
 Diagnosis: Osteoporosis  Provider:  Mannam, Praveen MD  Procedure: Injection  Evenity  (Romosozumab -aqqg), Dose: 510 mg, Site: subcutaneous, Number of injections: 2  Injection Site(s): Left lower quad. abdomen and Right lower quad. abdomne  Post Care: Patient declined observation  Discharge: Condition: Stable, Destination: Home . AVS Declined  Performed by:  Rocky FORBES Sar, RN

## 2023-12-10 NOTE — Telephone Encounter (Signed)
 I called the patient to discuss the next steps in treatment.  Discussed that I would recommend transitioning to IV reclast pending insurance approval Plan to discuss reclast in detail at upcoming visit-please move appointment to sometime in December 2025 (if possible or add to waitlist) so we can discuss reclast.  Please also pending DEXA order.

## 2023-12-10 NOTE — Progress Notes (Unsigned)
 Office Visit Note  Patient: Renee Wright             Date of Birth: 1970-02-07           MRN: [id number]             PCP: Ina Marcellus RAMAN, MD Referring: Ina Marcellus RAMAN, MD Visit Date: 12/11/2023 Occupation: Data Unavailable  Subjective:  Discuss use of Reclast   History of Present Illness: MICHELL GIULIANO is a 53 y.o. female with history of  osteoporosis.  Patient received the last dose of Evenity  on 11/30/23.  Patient presents today to discuss transitioning to IV reclast infusion.  She is taking a calcium supplement daily. She denies any recent falls or fractures.  She has been trying to go to exercise class at least twice a week.  She denies any new medical conditions.   Activities of Daily Living:  Patient reports morning stiffness for 5-10 minutes.   Patient Denies nocturnal pain.  Difficulty dressing/grooming: Denies Difficulty climbing stairs: Denies Difficulty getting out of chair: Denies Difficulty using hands for taps, buttons, cutlery, and/or writing: Denies  Review of Systems  Constitutional:  Negative for fatigue.  HENT:  Negative for mouth sores and mouth dryness.   Eyes:  Negative for dryness.  Respiratory:  Negative for shortness of breath.   Cardiovascular:  Negative for chest pain and palpitations.  Gastrointestinal:  Negative for blood in stool, constipation and diarrhea.  Endocrine: Negative for increased urination.  Genitourinary:  Negative for involuntary urination.  Musculoskeletal:  Positive for joint pain, joint pain and morning stiffness. Negative for gait problem, joint swelling, myalgias, muscle weakness, muscle tenderness and myalgias.  Skin:  Negative for color change, rash, hair loss and sensitivity to sunlight.  Allergic/Immunologic: Negative for susceptible to infections.  Neurological:  Negative for dizziness and headaches.  Hematological:  Negative for swollen glands.  Psychiatric/Behavioral:  Negative for depressed mood and sleep  disturbance. The patient is not nervous/anxious.     PMFS History:  Patient Active Problem List   Diagnosis Date Noted   Osteoporosis, postmenopausal 12/13/2022   Genetic testing 04/14/2015   Family history of breast cancer    Family history of uterine cancer    Family history of melanoma    Family history of colon cancer    Malignant neoplasm of upper-inner quadrant of right breast in female, estrogen receptor positive (HCC) 03/26/2015   Parasomnia, organic 10/19/2014   Nightmares REM-sleep type 10/19/2014   Irritable bowel syndrome with both constipation and diarrhea 10/19/2014   Breast cyst 01/13/2011    Past Medical History:  Diagnosis Date   Breast calcification, right 01/2011   Breast cancer (HCC)    Breast cancer of upper-inner quadrant of right female breast (HCC) 03/26/2015   Breast cyst 01/13/2011   Right breast at 7:30 areolar edge    Family history of breast cancer    Family history of colon cancer    Family history of melanoma    Family history of uterine cancer    IBS (irritable bowel syndrome)    Personal history of radiation therapy 2017   PONV (postoperative nausea and vomiting)     Family History  Problem Relation Age of Onset   Uterine cancer Mother 70   Atrial fibrillation Mother    Kidney Stones Mother    Osteoporosis Mother    Hydrocephalus Brother    Breast cancer Maternal Aunt 66   Melanoma Maternal Grandmother 54  mets to bladder   Colon cancer Paternal Grandmother        dx over 78   Healthy Daughter    Crohn's disease Daughter    Healthy Daughter    Past Surgical History:  Procedure Laterality Date   BREAST BIOPSY Left 10/1991   BREAST BIOPSY  02/02/2011   BREAST BIOPSY Right 03/20/2015   BREAST EXCISIONAL BIOPSY Right 02/02/2011   BREAST LUMPECTOMY Right 05/27/2015   BREAST LUMPECTOMY WITH RADIOACTIVE SEED AND SENTINEL LYMPH NODE BIOPSY Right 05/27/2015   Procedure: BREAST LUMPECTOMY WITH RADIOACTIVE SEED AND SENTINEL LYMPH NODE  BIOPSY;  Surgeon: Debby Shipper, MD;  Location: [facility];  Service: General;  Laterality: Right;   CESAREAN SECTION  05/29/96, 03/30/00   Social History   Tobacco Use   Smoking status: Never    Passive exposure: Never   Smokeless tobacco: Never  Vaping Use   Vaping status: Never Used  Substance Use Topics   Alcohol use: Yes    Comment: once or twice a month   Drug use: No   Social History   Social History Narrative   Married, 2 children.      Drinks one cup of caffeine daily.     Immunization History  Administered Date(s) Administered   Influenza-Unspecified 10/09/2017     Objective: Vital Signs: BP 92/61   Pulse 71   Temp 97.7 F (36.5 C)   Resp 14   Ht 5' 5 (1.651 m)   Wt 142 lb 6.4 oz (64.6 kg)   LMP 04/10/2015 (LMP Unknown)   BMI 23.70 kg/m    Physical Exam Vitals and nursing note reviewed.  Constitutional:      Appearance: She is well-developed.  HENT:     Head: Normocephalic and atraumatic.  Eyes:     Conjunctiva/sclera: Conjunctivae normal.  Cardiovascular:     Rate and Rhythm: Normal rate and regular rhythm.     Heart sounds: Normal heart sounds.  Pulmonary:     Effort: Pulmonary effort is normal.     Breath sounds: Normal breath sounds.  Abdominal:     General: Bowel sounds are normal.     Palpations: Abdomen is soft.  Musculoskeletal:     Cervical back: Normal range of motion.  Lymphadenopathy:     Cervical: No cervical adenopathy.  Skin:    General: Skin is warm and dry.     Capillary Refill: Capillary refill takes less than 2 seconds.  Neurological:     Mental Status: She is alert and oriented to person, place, and time.  Psychiatric:        Behavior: Behavior normal.      Musculoskeletal Exam: C-spine, thoracic spine, lumbar spine have good range of motion.  No midline spinal tenderness.  No SI joint tenderness.  Shoulder joints, elbow joints, wrist joints, MCPs, PIPs, DIPs have good range of motion with no  synovitis.  Complete fist formation bilaterally.  Hip joints have good range of motion with no groin pain.  Knee joints have good range of motion no warmth or effusion.  Ankle joints have good range of motion no tenderness or joint swelling.  No evidence of Achilles tendinitis or plantar fasciitis.   CDAI Exam: CDAI Score: -- Patient Global: --; Provider Global: -- Swollen: --; Tender: -- Joint Exam 12/11/2023   No joint exam has been documented for this visit   There is currently no information documented on the homunculus. Go to the Rheumatology activity and complete the homunculus joint  exam.  Investigation: No additional findings.  Imaging: No results found.  Recent Labs: Lab Results  Component Value Date   WBC 4.6 07/31/2023   HGB 14.0 07/31/2023   PLT 246 07/31/2023   NA 140 07/31/2023   K 4.6 07/31/2023   CL 105 07/31/2023   CO2 28 07/31/2023   GLUCOSE 89 07/31/2023   BUN 16 07/31/2023   CREATININE 0.90 07/31/2023   BILITOT 0.8 07/31/2023   ALKPHOS 58 09/28/2020   AST 31 07/31/2023   ALT 7 07/31/2023   PROT 6.5 07/31/2023   ALBUMIN 3.8 09/28/2020   CALCIUM 9.4 07/31/2023   GFRAA >60 09/29/2019    Speciality Comments: No specialty comments available.  Procedures:  No procedures performed Allergies: Patient has no known allergies.   Assessment / Plan:     Visit Diagnoses: Age-related osteoporosis without current pathological fracture: DEXA 03/06/22: LFN BMD 0.498 T-score -3.2, Lumbar spine T-score -3.0--first DEXA. FSH and estradiol  level May 2019 consistent with menopause.  History of breast cancer-required lumpectomy and radiation therapy-completed in August 2017.  No history of long-term prednisone use.  No history of fractures.  Patient has been strength training 2-3 days a week. Previous therapy: Fosamax started spring 2024-treated for 5 to 6 weeks-discontinued due to GI intolerance. Patient was initiated on Evenity  monthly injections on 01/09/2023.  Last  dose of Evenity  was administered on 11/30/2023.  Patient presented today to discuss the next steps in treatment--plan to transition the patient to IV Reclast pending insurance approval and lab results.  Reviewed indications, contraindications, potential side effects of IV Reclast today in detail.  All questions were addressed.  Plan to apply for Reclast through insurance.  Plan to also update CBC, CMP, and vitamin D  today.  Patient would like to have an updated bone density prior to initiating IV Reclast so she will be having this performed at her PCPs office and will have results forwarded to our office to review.  She will follow up in 6 months or sooner if needed.   Vitamin D  deficiency - Vitamin D  was 48 on 07/31/2023.  Vitamin D  will be obtained today. Plan: VITAMIN D  25 Hydroxy (Vit-D Deficiency, Fractures)  Medication monitoring encounter -Plan to proceed with IV Reclast pending lab results and insurance approval.  CBC and CMP updated on 07/31/23.  Vitamin D  07/31/23. Orders for CBC, CMP, and vitamin D  updated today.    plan: CBC with Differential/Platelet, VITAMIN D  25 Hydroxy (Vit-D Deficiency, Fractures), Comprehensive metabolic panel with GFR  Other medical conditions are listed as follows:  Malignant neoplasm of upper-inner quadrant of right breast in female, estrogen receptor positive (HCC)  Sarcoma of uterus (HCC)  Nightmares REM-sleep type  Irritable bowel syndrome with both constipation and diarrhea  Family history of breast cancer  Family history of melanoma  Family history of colon cancer  Family history of uterine cancer  Parasomnia, organic  History of herpes zoster  Orders: Orders Placed This Encounter  Procedures   CBC with Differential/Platelet   VITAMIN D  25 Hydroxy (Vit-D Deficiency, Fractures)   Comprehensive metabolic panel with GFR   No orders of the defined types were placed in this encounter.   Follow-Up Instructions: Return in about 6 months  (around 06/10/2024) for Osteoporosis.   Waddell CHRISTELLA Craze, PA-C  Note - This record has been created using Dragon software.  Chart creation errors have been sought, but may not always  have been located. Such creation errors do not reflect on  the standard of medical  care.

## 2023-12-10 NOTE — Telephone Encounter (Signed)
 Patient scheduled for 12/11/2023 at 1:30 pm. Order for DEXA placed.

## 2023-12-11 ENCOUNTER — Encounter: Payer: Self-pay | Admitting: Physician Assistant

## 2023-12-11 ENCOUNTER — Ambulatory Visit: Attending: Physician Assistant | Admitting: Physician Assistant

## 2023-12-11 ENCOUNTER — Telehealth: Payer: Self-pay

## 2023-12-11 VITALS — BP 92/61 | HR 71 | Temp 97.7°F | Resp 14 | Ht 65.0 in | Wt 142.4 lb

## 2023-12-11 DIAGNOSIS — Z8049 Family history of malignant neoplasm of other genital organs: Secondary | ICD-10-CM

## 2023-12-11 DIAGNOSIS — C50211 Malignant neoplasm of upper-inner quadrant of right female breast: Secondary | ICD-10-CM

## 2023-12-11 DIAGNOSIS — Z803 Family history of malignant neoplasm of breast: Secondary | ICD-10-CM

## 2023-12-11 DIAGNOSIS — K582 Mixed irritable bowel syndrome: Secondary | ICD-10-CM

## 2023-12-11 DIAGNOSIS — Z800 Family history of malignant neoplasm of digestive organs: Secondary | ICD-10-CM

## 2023-12-11 DIAGNOSIS — G4750 Parasomnia, unspecified: Secondary | ICD-10-CM

## 2023-12-11 DIAGNOSIS — F515 Nightmare disorder: Secondary | ICD-10-CM

## 2023-12-11 DIAGNOSIS — Z808 Family history of malignant neoplasm of other organs or systems: Secondary | ICD-10-CM

## 2023-12-11 DIAGNOSIS — C549 Malignant neoplasm of corpus uteri, unspecified: Secondary | ICD-10-CM

## 2023-12-11 DIAGNOSIS — Z5181 Encounter for therapeutic drug level monitoring: Secondary | ICD-10-CM | POA: Diagnosis not present

## 2023-12-11 DIAGNOSIS — Z8619 Personal history of other infectious and parasitic diseases: Secondary | ICD-10-CM

## 2023-12-11 DIAGNOSIS — E559 Vitamin D deficiency, unspecified: Secondary | ICD-10-CM | POA: Diagnosis not present

## 2023-12-11 DIAGNOSIS — Z170 Estrogen receptor positive status [ER+]: Secondary | ICD-10-CM

## 2023-12-11 DIAGNOSIS — M810 Age-related osteoporosis without current pathological fracture: Secondary | ICD-10-CM | POA: Diagnosis not present

## 2023-12-11 NOTE — Telephone Encounter (Signed)
 Opened in error

## 2023-12-12 ENCOUNTER — Ambulatory Visit: Payer: Self-pay | Admitting: Physician Assistant

## 2023-12-12 ENCOUNTER — Telehealth: Payer: Self-pay | Admitting: Pharmacist

## 2023-12-12 LAB — HM DEXA SCAN

## 2023-12-12 NOTE — Progress Notes (Signed)
 Glucose is 112, AST is slightly elevated-40. ALT WNL.  Any recent alcohol use? Tylenol  use? Rest of CMP WNL. CBC WNL

## 2023-12-12 NOTE — Telephone Encounter (Addendum)
 Per review of OV note, patient has elected to start Reclast after completing updating DEXA through PCP (she is having DEXA completed before end of year)  Referral placed for Zoledronic acid (Reclast) IV (G6510) at [facility] to start benefits investigation.  Diagnosis: osteoporosis  Provider: Dr. Maya Nash and Waddell Craze, PA-C  Dose: 5mg  IV every 12 months Premedications: acetaminophen  650mg  p.o. and diphenhydramine 25mg  p.o.  Last dose/infusion date: last Evenity  dose Last Clinic Visit: 12/11/2023 Next Clinic Visit: 06/11/2024  Pertinent baseline labs: CMP and Vitamin D  12/11/2023 - renal function wnl, calcium wnl  Once benefits are processed, infusion center will contact patient to schedule.   ----- Message from Shelba SHAUNNA Potters sent at 12/11/2023  4:50 PM EST ----- Regarding: IV Reclast New Start Per Waddell Craze, PA-C, pending labs and approval patient will start IV Reclast.

## 2023-12-13 LAB — CBC WITH DIFFERENTIAL/PLATELET
Absolute Lymphocytes: 1810 {cells}/uL (ref 850–3900)
Absolute Monocytes: 457 {cells}/uL (ref 200–950)
Basophils Absolute: 50 {cells}/uL (ref 0–200)
Basophils Relative: 0.9 %
Eosinophils Absolute: 121 {cells}/uL (ref 15–500)
Eosinophils Relative: 2.2 %
HCT: 40.5 % (ref 35.9–46.0)
Hemoglobin: 13.2 g/dL (ref 11.7–15.5)
MCH: 28.6 pg (ref 27.0–33.0)
MCHC: 32.6 g/dL (ref 31.6–35.4)
MCV: 87.7 fL (ref 81.4–101.7)
MPV: 10.8 fL (ref 7.5–12.5)
Monocytes Relative: 8.3 %
Neutro Abs: 3064 {cells}/uL (ref 1500–7800)
Neutrophils Relative %: 55.7 %
Platelets: 244 Thousand/uL (ref 140–400)
RBC: 4.62 Million/uL (ref 3.80–5.10)
RDW: 13 % (ref 11.0–15.0)
Total Lymphocyte: 32.9 %
WBC: 5.5 Thousand/uL (ref 3.8–10.8)

## 2023-12-13 LAB — COMPREHENSIVE METABOLIC PANEL WITH GFR
AG Ratio: 1.9 (calc) (ref 1.0–2.5)
ALT: 11 U/L (ref 6–29)
AST: 40 U/L — ABNORMAL HIGH (ref 10–35)
Albumin: 4.2 g/dL (ref 3.6–5.1)
Alkaline phosphatase (APISO): 67 U/L (ref 37–153)
BUN: 15 mg/dL (ref 7–25)
CO2: 27 mmol/L (ref 20–32)
Calcium: 9.1 mg/dL (ref 8.6–10.4)
Chloride: 106 mmol/L (ref 98–110)
Creat: 0.91 mg/dL (ref 0.50–1.03)
Globulin: 2.2 g/dL (ref 1.9–3.7)
Glucose, Bld: 112 mg/dL — ABNORMAL HIGH (ref 65–99)
Potassium: 3.8 mmol/L (ref 3.5–5.3)
Sodium: 143 mmol/L (ref 135–146)
Total Bilirubin: 0.8 mg/dL (ref 0.2–1.2)
Total Protein: 6.4 g/dL (ref 6.1–8.1)
eGFR: 75 mL/min/1.73m2 (ref >=60)

## 2023-12-13 LAB — VITAMIN D 25 HYDROXY (VIT D DEFICIENCY, FRACTURES): Vit D, 25-Hydroxy: 46 ng/mL (ref 30–100)

## 2023-12-13 NOTE — Progress Notes (Signed)
 Vitamin D WNL.

## 2023-12-26 ENCOUNTER — Telehealth: Payer: Self-pay

## 2023-12-26 ENCOUNTER — Other Ambulatory Visit: Payer: Self-pay | Admitting: Physician Assistant

## 2023-12-26 MED ORDER — ZOLEDRONIC ACID 5 MG/100ML IV SOLN: INTRAVENOUS | 0 refills | Status: AC

## 2023-12-26 NOTE — Telephone Encounter (Signed)
 Received DEXA results from [facility].  Date of Scan: 12/12/2023  Lowest T-score:-2.8  BMD:0.734  Lowest site measured:Lumbar Spine  DX: Osteoporosis  Significant changes in BMD and site measured (5% and above):N/A  Current Regimen: Reclast  IV, not started yet  Evenity , last dose 11/30/2023 Calcium  Recommendation:Osteoposis, improving. Planning to proceed with Reclast . Patient completed Evenity   1 year.  Reviewed by:Waddell Craze, PA-C  Next Appointment:  06/11/2024

## 2023-12-26 NOTE — Telephone Encounter (Signed)
 Waddell, patient will be scheduled as soon as possible.  Auth Submission: APPROVED Site of care: Site of care: [facility] Payer: BCBS commercial Medication & CPT/J Code(s) submitted: Reclast  (Zolendronic acid) [id number] Diagnosis Code:  Route of submission (phone, fax, portal): fax Phone # Fax # [phone]  Auth type: Buy/Bill PB Units/visits requested: 5mg  x 1 dose Reference number: ASO ASBM Preferred [id number] Approval from: 12/13/23 to 12/11/24   Will have to use [facility], fyi flag has been placed as well as in the spreadsheet.

## 2024-01-11 ENCOUNTER — Other Ambulatory Visit: Payer: Self-pay | Admitting: Obstetrics and Gynecology

## 2024-01-11 DIAGNOSIS — R9230 Dense breasts, unspecified: Secondary | ICD-10-CM

## 2024-01-18 ENCOUNTER — Ambulatory Visit

## 2024-01-18 VITALS — BP 106/72 | HR 61 | Temp 98.0°F | Resp 12 | Ht 65.0 in | Wt 141.0 lb

## 2024-01-18 DIAGNOSIS — M810 Age-related osteoporosis without current pathological fracture: Secondary | ICD-10-CM | POA: Diagnosis not present

## 2024-01-18 MED ORDER — ACETAMINOPHEN 325 MG PO TABS
650.0000 mg | ORAL_TABLET | Freq: Once | ORAL | Status: AC
  Administered 2024-01-18: 650 mg via ORAL
  Filled 2024-01-18: qty 2

## 2024-01-18 MED ORDER — SODIUM CHLORIDE 0.9 % IV SOLN: INTRAVENOUS | Status: DC

## 2024-01-18 MED ORDER — ZOLEDRONIC ACID 5 MG/100ML IV SOLN
5.0000 mg | Freq: Once | INTRAVENOUS | Status: AC
  Administered 2024-01-18: 5 mg via INTRAVENOUS
  Filled 2024-01-18: qty 100

## 2024-01-18 MED ORDER — DIPHENHYDRAMINE HCL 25 MG PO CAPS
25.0000 mg | ORAL_CAPSULE | Freq: Once | ORAL | Status: AC
  Administered 2024-01-18: 25 mg via ORAL
  Filled 2024-01-18: qty 1

## 2024-01-18 NOTE — Progress Notes (Signed)
 Diagnosis: , Osteoporosis  Provider:  Lonna Coder MD  Procedure: IV Infusion  IV Type: Peripheral, IV Location: L Antecubital  , Reclast  (Zolendronic Acid), Dose: 5 mg  Infusion Start Time: 1347  Infusion Stop Time: 1418  Post Infusion IV Care: Observation period completed and Peripheral IV Discontinued  Discharge: Condition: Good, Destination: Home . AVS Declined  Performed by:  Donny Childes, RN

## 2024-01-31 ENCOUNTER — Ambulatory Visit: Admitting: Physician Assistant

## 2024-02-11 ENCOUNTER — Other Ambulatory Visit

## 2024-03-08 ENCOUNTER — Other Ambulatory Visit

## 2024-03-11 ENCOUNTER — Other Ambulatory Visit

## 2024-06-11 ENCOUNTER — Ambulatory Visit: Admitting: Rheumatology
